# Patient Record
Sex: Female | Born: 2000 | Race: White | Hispanic: No | Marital: Single | Smoking: Never smoker
Health system: Southern US, Community
[De-identification: ages and names within clinical notes are randomized; demographics above are authoritative.]

## PROBLEM LIST (undated history)

## (undated) DIAGNOSIS — J45909 Unspecified asthma, uncomplicated: Secondary | ICD-10-CM

## (undated) HISTORY — PX: TONSILLECTOMY AND ADENOIDECTOMY: SUR1326

## (undated) HISTORY — PX: ADENOIDECTOMY: SUR15

---
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
## 2022-04-24 NOTE — L&D Delivery Note (Signed)
[facility] Delivery Note  Brooke Small is a 22 y.o. G1P0000 s/p SVD at [redacted]w[redacted]d. She was admitted for IOL for severe pre E.   ROM: 15h 36m with clear fluid GBS Status:  NEGATIVE/-- (10/04 1913) Maximum Maternal Temperature:  Temp (24hrs), Avg:98 F (36.7 C), Min:97.8 F (36.6 C), Max:98.2 F (36.8 C)    Labor Progress: Patient arrived at 0 cm dilation and was induced with foley, AROM, pitocin.   Delivery Date/Time: 01/28/2023 at 1504. Delivery: Called to room because of deceleration followed by difficulty tracing fetal heart rate.  On evaluation patient was complete and +3.  Asked patient to immediately start pushing.  Head delivered in direct OA position. No nuchal cord present. Shoulder and body delivered in usual fashion. Infant with spontaneous cry, placed on mother's abdomen, dried and stimulated. Cord clamped x 2 after 1-minute delay, and cut by provider. Cord blood drawn. Placenta delivered spontaneously with gentle cord traction. Fundus firm with massage and Pitocin. Labia, perineum, vagina, and cervix inspected with 1st degree perineal laceration .   Placenta:  spontaneous, intact, 3 vessel cord, cord pH collected and was 7.16 Complications: None Lacerations: First-degree perineal repaired with 3-0 Vicryl EBL: 41 mL Analgesia: Epidural in place  Infant: APGAR (1 MIN):   APGAR (5 MINS):   APGAR (10 MINS):    Weight: 1280 g  Derrel Nip, MD  OB Fellow  01/28/2023 3:44 PM

## 2022-04-26 DIAGNOSIS — J029 Acute pharyngitis, unspecified: Secondary | ICD-10-CM | POA: Diagnosis not present

## 2022-04-26 DIAGNOSIS — B9689 Other specified bacterial agents as the cause of diseases classified elsewhere: Secondary | ICD-10-CM | POA: Diagnosis not present

## 2022-04-26 DIAGNOSIS — Z03818 Encounter for observation for suspected exposure to other biological agents ruled out: Secondary | ICD-10-CM | POA: Diagnosis not present

## 2022-04-26 DIAGNOSIS — J209 Acute bronchitis, unspecified: Secondary | ICD-10-CM | POA: Diagnosis not present

## 2022-04-26 DIAGNOSIS — J019 Acute sinusitis, unspecified: Secondary | ICD-10-CM | POA: Diagnosis not present

## 2022-07-31 ENCOUNTER — Ambulatory Visit (LOCAL_COMMUNITY_HEALTH_CENTER): Payer: 59

## 2022-07-31 VITALS — BP 114/59 | Ht 65.0 in | Wt 271.5 lb

## 2022-07-31 DIAGNOSIS — Z3201 Encounter for pregnancy test, result positive: Secondary | ICD-10-CM

## 2022-07-31 LAB — PREGNANCY, URINE: Preg Test, Ur: POSITIVE — AB

## 2022-07-31 MED ORDER — PRENATAL 27-0.8 MG PO TABS: 1.0000 | ORAL_TABLET | Freq: Every day | ORAL | 0 refills | Status: AC

## 2022-07-31 NOTE — Progress Notes (Signed)
UPT Positive. Plans prenatal care at [facility] and has appt 08/15/2022. Positive preg packet given and reviewed.   The patient was dispensed prenatal vitamins #100 today per SO Dr Ralene Bathe. I provided counseling today regarding the medication. We discussed the medication, the side effects and when to call clinic. Patient given the opportunity to ask questions. Questions answered.    Sent to DSS for medicaid/preg women. Jerel Shepherd, RN

## 2022-08-15 ENCOUNTER — Encounter: Payer: Self-pay | Admitting: Licensed Practical Nurse

## 2022-08-17 ENCOUNTER — Ambulatory Visit (INDEPENDENT_AMBULATORY_CARE_PROVIDER_SITE_OTHER): Payer: 59

## 2022-08-17 ENCOUNTER — Telehealth: Payer: Self-pay | Admitting: Advanced Practice Midwife

## 2022-08-17 VITALS — Wt 268.0 lb

## 2022-08-17 DIAGNOSIS — Z369 Encounter for antenatal screening, unspecified: Secondary | ICD-10-CM

## 2022-08-17 DIAGNOSIS — Z3689 Encounter for other specified antenatal screening: Secondary | ICD-10-CM

## 2022-08-17 DIAGNOSIS — Z131 Encounter for screening for diabetes mellitus: Secondary | ICD-10-CM

## 2022-08-17 DIAGNOSIS — O0990 Supervision of high risk pregnancy, unspecified, unspecified trimester: Secondary | ICD-10-CM | POA: Insufficient documentation

## 2022-08-17 DIAGNOSIS — Z3480 Encounter for supervision of other normal pregnancy, unspecified trimester: Secondary | ICD-10-CM | POA: Insufficient documentation

## 2022-08-17 NOTE — Progress Notes (Signed)
New OB Intake  I connected with  Brooke Small on 08/17/22 at  1:15 PM EDT by telephone and verified that I am speaking with the correct person using two identifiers. Nurse is located at [facility] and pt is located at home.  I explained I am completing New OB Intake today. We discussed her EDD of 03/22/2023 that is based on LMP of 06/15/2022. Pt is G1/P0. I reviewed her allergies, medications, Medical/Surgical/OB history, and appropriate screenings. There are no cats in the home.  Based on history, this is a/an pregnancy uncomplicated .   Patient Active Problem List   Diagnosis Date Noted   Supervision of other normal pregnancy, antepartum 08/17/2022    Concerns addressed today None  Delivery Plans:  Plans to deliver at [facility].  Anatomy US Explained first scheduled [place] will be scheduled soon and an anatomy scan will be done at 20 weeks.  Labs Discussed genetic screening with patient. Patient desires genetic testing to be drawn with new OB labs. Discussed possible labs to be drawn at new OB appointment.  COVID Vaccine Patient has had COVID vaccine.   Social Determinants of Health Food Insecurity: denies food insecurity Transportation: Patient denies transportation needs.  First visit review I reviewed new OB appt with pt. I explained she will have ob bloodwork and pap smear/pelvic exam if indicated. Explained pt will be seen by Tresea Mall, CNM at first visit; encounter routed to appropriate provider.   Loran Senters, [place] 08/17/2022  2:00 PM

## 2022-08-17 NOTE — Telephone Encounter (Addendum)
Called patient left message for her to call back to schedule a dating scan.  She needs to schedule this scan on or before Aug 30, 2021(which is the day of her labs)

## 2022-08-22 NOTE — Telephone Encounter (Signed)
The patient is scheduled for 5/9 for dating and labs

## 2022-08-31 ENCOUNTER — Other Ambulatory Visit: Payer: 59

## 2022-08-31 ENCOUNTER — Ambulatory Visit (INDEPENDENT_AMBULATORY_CARE_PROVIDER_SITE_OTHER): Payer: 59

## 2022-08-31 ENCOUNTER — Other Ambulatory Visit: Payer: Self-pay | Admitting: Advanced Practice Midwife

## 2022-08-31 DIAGNOSIS — Z3687 Encounter for antenatal screening for uncertain dates: Secondary | ICD-10-CM | POA: Diagnosis not present

## 2022-08-31 DIAGNOSIS — Z131 Encounter for screening for diabetes mellitus: Secondary | ICD-10-CM

## 2022-08-31 DIAGNOSIS — Z3A10 10 weeks gestation of pregnancy: Secondary | ICD-10-CM

## 2022-08-31 DIAGNOSIS — Z3480 Encounter for supervision of other normal pregnancy, unspecified trimester: Secondary | ICD-10-CM

## 2022-08-31 DIAGNOSIS — Z369 Encounter for antenatal screening, unspecified: Secondary | ICD-10-CM

## 2022-09-01 ENCOUNTER — Other Ambulatory Visit (HOSPITAL_COMMUNITY)
Admission: RE | Admit: 2022-09-01 | Discharge: 2022-09-01 | Disposition: A | Discharge status: Home / Self Care | Payer: 59 | Source: Ambulatory Visit | Attending: Advanced Practice Midwife | Admitting: Advanced Practice Midwife

## 2022-09-01 DIAGNOSIS — Z3480 Encounter for supervision of other normal pregnancy, unspecified trimester: Secondary | ICD-10-CM | POA: Insufficient documentation

## 2022-09-01 DIAGNOSIS — Z369 Encounter for antenatal screening, unspecified: Secondary | ICD-10-CM | POA: Diagnosis not present

## 2022-09-02 LAB — URINALYSIS, ROUTINE W REFLEX MICROSCOPIC
Bilirubin, UA: NEGATIVE
Ketones, UA: NEGATIVE
Leukocytes,UA: NEGATIVE
Nitrite, UA: NEGATIVE
RBC, UA: NEGATIVE
Specific Gravity, UA: 1.028 (ref 1.005–1.030)
Urobilinogen, Ur: 1 mg/dL (ref 0.2–1.0)
pH, UA: >=9 — AB (ref 5.0–7.5)

## 2022-09-02 LAB — MICROSCOPIC EXAMINATION
Casts: NONE SEEN /lpf
Epithelial Cells (non renal): >10 /hpf — AB (ref 0–10)
WBC, UA: NONE SEEN /hpf (ref 0–5)

## 2022-09-03 LAB — CULTURE, OB URINE

## 2022-09-03 LAB — URINE CULTURE, OB REFLEX

## 2022-09-04 LAB — URINE CYTOLOGY ANCILLARY ONLY
Chlamydia: NEGATIVE
Comment: NEGATIVE
Comment: NORMAL
Neisseria Gonorrhea: NEGATIVE

## 2022-09-07 ENCOUNTER — Encounter: Payer: Self-pay | Admitting: Advanced Practice Midwife

## 2022-09-07 ENCOUNTER — Ambulatory Visit: Payer: 59 | Admitting: Advanced Practice Midwife

## 2022-09-07 VITALS — BP 126/76 | HR 98 | Wt 265.0 lb

## 2022-09-07 DIAGNOSIS — Z3A12 12 weeks gestation of pregnancy: Secondary | ICD-10-CM

## 2022-09-07 DIAGNOSIS — O99210 Obesity complicating pregnancy, unspecified trimester: Secondary | ICD-10-CM | POA: Insufficient documentation

## 2022-09-07 DIAGNOSIS — Z369 Encounter for antenatal screening, unspecified: Secondary | ICD-10-CM

## 2022-09-07 DIAGNOSIS — O0991 Supervision of high risk pregnancy, unspecified, first trimester: Secondary | ICD-10-CM

## 2022-09-07 DIAGNOSIS — Z3689 Encounter for other specified antenatal screening: Secondary | ICD-10-CM

## 2022-09-07 DIAGNOSIS — E669 Obesity, unspecified: Secondary | ICD-10-CM

## 2022-09-07 DIAGNOSIS — O99211 Obesity complicating pregnancy, first trimester: Secondary | ICD-10-CM | POA: Diagnosis not present

## 2022-09-07 DIAGNOSIS — O99212 Obesity complicating pregnancy, second trimester: Secondary | ICD-10-CM

## 2022-09-07 LAB — CBC/D/PLT+RPR+RH+ABO+RUBIGG...
Antibody Screen: NEGATIVE
Basophils Absolute: 0 10*3/uL (ref 0.0–0.2)
Basos: 0 %
EOS (ABSOLUTE): 0.1 10*3/uL (ref 0.0–0.4)
Eos: 1 %
HCV Ab: NONREACTIVE
HIV Screen 4th Generation wRfx: NONREACTIVE
Hematocrit: 37.4 % (ref 34.0–46.6)
Hemoglobin: 12.6 g/dL (ref 11.1–15.9)
Hepatitis B Surface Ag: NEGATIVE
Immature Grans (Abs): 0 10*3/uL (ref 0.0–0.1)
Immature Granulocytes: 0 %
Lymphocytes Absolute: 2.3 10*3/uL (ref 0.7–3.1)
Lymphs: 21 %
MCH: 26.7 pg (ref 26.6–33.0)
MCHC: 33.7 g/dL (ref 31.5–35.7)
MCV: 79 fL (ref 79–97)
Monocytes Absolute: 0.5 10*3/uL (ref 0.1–0.9)
Monocytes: 5 %
Neutrophils Absolute: 8.1 10*3/uL — ABNORMAL HIGH (ref 1.4–7.0)
Neutrophils: 73 %
Platelets: 297 10*3/uL (ref 150–450)
RBC: 4.72 x10E6/uL (ref 3.77–5.28)
RDW: 13.4 % (ref 11.7–15.4)
RPR Ser Ql: NONREACTIVE
Rh Factor: POSITIVE
Rubella Antibodies, IGG: <0.9 index — ABNORMAL LOW (ref >0.99)
Varicella zoster IgG: <135 index — ABNORMAL LOW (ref >165)
WBC: 11.1 10*3/uL — ABNORMAL HIGH (ref 3.4–10.8)

## 2022-09-07 LAB — MATERNIT 21 PLUS CORE, BLOOD: Fetal Fraction: <3

## 2022-09-07 LAB — MONITOR DRUG PROFILE 14(MW)

## 2022-09-07 LAB — NICOTINE SCREEN, URINE

## 2022-09-07 LAB — HEMOGLOBIN A1C
Est. average glucose Bld gHb Est-mCnc: 117 mg/dL
Hgb A1c MFr Bld: 5.7 % — ABNORMAL HIGH (ref 4.8–5.6)

## 2022-09-07 LAB — HCV INTERPRETATION

## 2022-09-07 MED ORDER — ASPIRIN 81 MG PO TBEC
81.0000 mg | DELAYED_RELEASE_TABLET | Freq: Every day | ORAL | 2 refills | Status: DC
Start: 2022-09-07 — End: 2022-10-04

## 2022-09-07 NOTE — Progress Notes (Signed)
U/S Order updated to reflect 18-20 wk MFM Anatomy scan. Previous requested date was incorrect.

## 2022-09-07 NOTE — Progress Notes (Signed)
[facility]   New Obstetric Patient H&P    Chief Complaint: "Desires prenatal care"   History of Present Illness: Patient is a 22 y.o. G1P0000 Not Hispanic or Latino female, presents with amenorrhea and positive home pregnancy test. Patient's last menstrual period was 06/15/2022 (exact date). and based on her  LMP, her EDD is Estimated Date of Delivery: 03/22/23 and her EGA is [redacted]w[redacted]d. Cycles are 3-4 days, regular, and occur approximately every : 28 days.    She had a urine pregnancy test which was positive 6 week(s)  ago. Her last menstrual period was normal and lasted for  3 or 4 day(s). Since her LMP she claims she has experienced breast tenderness, fatigue, nausea, vomiting. She denies vaginal bleeding. Her past medical history is noncontributory. Her prior pregnancies are notable for none  Since her LMP, she admits to the use of tobacco products  no She claims she has lost  7  pounds since the start of her pregnancy.  There are cats in the home in the home  no  She admits close contact with children on a regular basis  no  She has had chicken pox in the past yes She has had Tuberculosis exposures, symptoms, or previously tested positive for TB   no Current or past history of domestic violence. no  Genetic Screening/Teratology Counseling: (Includes patient, baby's father, or anyone in either family with:)   1. Patient's age >/= 70 at [facility]  no 2. Thalassemia ([place], [place], Mediterranean, or Asian background): MCV<80  no 3. Neural tube defect (meningomyelocele, spina bifida, anencephaly)  no 4. Congenital heart defect  no  5. Down syndrome  no 6. Tay-Sachs (Jewish, [place]  no 7. Canavan's Disease  no 8. Sickle cell disease or trait (African)  no  9. Hemophilia or other blood disorders  no  10. Muscular dystrophy  no  11. Cystic fibrosis  no  12. Huntington's Chorea  no  13. Mental retardation/autism  no 14. Other inherited genetic or chromosomal disorder  no 15.  Maternal metabolic disorder (DM, PKU, etc)  no 16. Patient or FOB with a child with a birth defect not listed above no  16a. Patient or FOB with a birth defect themselves no 17. Recurrent pregnancy loss, or stillbirth  no  18. Any medications since LMP other than prenatal vitamins (include vitamins, supplements, OTC meds, drugs, alcohol)  no 19. Any other genetic/environmental exposure to discuss  no  Infection History:   1. Lives with someone with TB or TB exposed  no  2. Patient or partner has history of genital herpes  no 3. Rash or viral illness since LMP  no 4. History of STI (GC, CT, HPV, syphilis, HIV)  no 5. History of recent travel :  no  Other pertinent information:  She is a Lawyer at [employer]. FOB is not involved in the pregnancy. She lives with her dad. Her mom accompanied her to the visit today. She admits good support system.   Review of Systems:10 point review of systems negative unless otherwise noted in HPI  Past Medical History:  Patient Active Problem List   Diagnosis Date Noted   Obesity affecting pregnancy 09/07/2022   Supervision of high-risk pregnancy 08/17/2022     Clinical Staff Provider  Office Location  [facility] Dating  By LMP c/w 10w u/s  Language  English Anatomy US    Flu Vaccine  UTD Genetic Screen  NIPS:   TDaP vaccine   ACHD Hgb  A1C or  GTT Early : Hgb A1C 5.7 Third trimester :   Covid One dose   LAB RESULTS   Rhogam  A/Positive/-- (05/09 1630)  Blood Type A/Positive/-- (05/09 1630)   Feeding Plan breast Antibody Negative (05/09 1630)  Contraception undecided Rubella <0.90 (05/09 1630)  Circumcision yes RPR Non Reactive (05/09 1630)   Pediatrician  undecided HBsAg Negative (05/09 1630)   Support Person Raiden HIV Non Reactive (05/09 1630)  Prenatal Classes  Varicella     GBS  (For PCN allergy, check sensitivities)   BTL Consent  Hep C Non Reactive (05/09 1630)   VBAC Consent NA Pap No results found for: "DIAGPAP"    Hgb Electro      CF       SMA        Obesity BMI 45 at NOB Early Hgb A1C 5.7 prediabetes- referral to Lifestyles sent Low dose ASA start at 12 weeks Anesthesia consult Growth scans NSTs    Anxiety 09/06/2015    Past Surgical History:  Past Surgical History:  Procedure Laterality Date   TONSILLECTOMY AND ADENOIDECTOMY      Gynecologic History: Patient's last menstrual period was 06/15/2022 (exact date).  Obstetric History: G1P0000  Family History:  Family History  Problem Relation Age of Onset   Healthy Father    Healthy Sister    Healthy Brother    Heart attack Maternal Grandmother    Hypertension Maternal Grandmother    Diabetes Maternal Grandmother    Healthy Maternal Grandfather    Diabetes Paternal Grandfather    Liver disease Paternal Grandfather        failure   Heart attack Paternal Grandfather     Social History:  Social History   Socioeconomic History   Marital status: Single    Spouse name: Not on file   Number of children: 0   Years of education: 14   Highest education level: Not on file  Occupational History   Occupation: Psychologist, sport and exercise at [employer]  Tobacco Use   Smoking status: Never   Smokeless tobacco: Never  Vaping Use   Vaping Use: Never used  Substance and Sexual Activity   Alcohol use: Never   Drug use: Never   Sexual activity: Yes    Partners: Male    Birth control/protection: None  Other Topics Concern   Not on file  Social History Narrative   Not on file   Social Determinants of Health   Financial Resource Strain: Low Risk  (08/17/2022)   Overall Financial Resource Strain (CARDIA)    Difficulty of Paying Living Expenses: Not very hard  Food Insecurity: No Food Insecurity (08/17/2022)   Hunger Vital Sign    Worried About Running Out of Food in the Last Year: Never true    Ran Out of Food in the Last Year: Never true  Transportation Needs: No Transportation Needs (08/17/2022)   PRAPARE - Administrator, Civil Service (Medical): No    Lack of  Transportation (Non-Medical): No  Physical Activity: Inactive (08/17/2022)   Exercise Vital Sign    Days of Exercise per Week: 0 days    Minutes of Exercise per Session: 0 min  Stress: No Stress Concern Present (08/17/2022)   [employer] of Occupational Health - Occupational Stress Questionnaire    Feeling of Stress : Not at all  Social Connections: Unknown (08/17/2022)   Social Connection and Isolation Panel [NHANES]    Frequency of Communication with Friends and Family: Three times a  week    Frequency of Social Gatherings with Friends and Family: Twice a week    Attends Religious Services: More than 4 times per year    Active Member of [employer] or Organizations: No    Attends Banker Meetings: Never    Marital Status: Not on file  Intimate Partner Violence: Not At Risk (08/17/2022)   Humiliation, Afraid, Rape, and Kick questionnaire    Fear of Current or Ex-Partner: No    Emotionally Abused: No    Physically Abused: No    Sexually Abused: No    Allergies:  No Known Allergies  Medications: Prior to Admission medications   Medication Sig Start Date End Date Taking? Authorizing Provider  aspirin EC 81 MG tablet Take 1 tablet (81 mg total) by mouth daily. Take after 12 weeks for prevention of preeclampssia later in pregnancy 09/07/22  Yes Tresea Mall, CNM  Prenatal Vit-Fe Fumarate-FA (MULTIVITAMIN-PRENATAL) 27-0.8 MG TABS tablet Take 1 tablet by mouth daily at 12 noon. 07/31/22 11/08/22 Yes Federico Flake, MD    Physical Exam Vitals: Blood pressure 126/76, pulse 98, weight 265 lb (120.2 kg), last menstrual period 06/15/2022.  General: NAD HEENT: normocephalic, anicteric Thyroid: no enlargement, no palpable nodules Pulmonary: No increased work of breathing, CTAB Cardiovascular: RRR, distal pulses 2+ Abdomen: NABS, soft, non-tender, non-distended.  Umbilicus without lesions.  No hepatomegaly, splenomegaly or masses palpable. No evidence of hernia   Genitourinary: deferred for no concerns/labs previously done Extremities: no edema, erythema, or tenderness Neurologic: Grossly intact Psychiatric: mood appropriate, affect full   The following were addressed during this visit:  Breastfeeding Education - Early initiation of breastfeeding    Comments: Keeps milk supply adequate, helps contract uterus and slow bleeding, and early milk is the perfect first food and is easy to digest.   - The importance of exclusive breastfeeding    Comments: Provides antibodies, Lower risk of breast and ovarian cancers, and type-2 diabetes,Helps your body recover, Reduced chance of SIDS.   - Risks of giving your baby anything other than breast milk if you are breastfeeding    Comments: Make the baby less content with breastfeeds, may make my baby more susceptible to illness, and may reduce my milk supply.   - The importance of early skin-to-skin contact    Comments:  Keeps baby warm and secure, helps keep baby's blood sugar up and breathing steady, easier to bond and breastfeed, and helps calm baby.  - Rooming-in on a 24-hour basis    Comments: Easier to learn baby's feeding cues, easier to bond and get to know each other, and encourages milk production.   - Feeding on demand or baby-led feeding    Comments: Helps prevent breastfeeding complications, helps bring in good milk supply, prevents under or overfeeding, and helps baby feel content and satisfied   - Frequent feeding to help assure optimal milk production    Comments: Making a full supply of milk requires frequent removal of milk from breasts, infant will eat 8-12 times in 24 hours, if separated from infant use breast massage, hand expression and/ or pumping to remove milk from breasts.   - Effective positioning and attachment    Comments: Helps my baby to get enough breast milk, helps to produce an adequate milk supply, and helps prevent nipple pain and damage   - Exclusive  breastfeeding for the first 6 months    Comments: Builds a healthy milk supply and keeps it up, protects baby from sickness and disease,  and breastmilk has everything your baby needs for the first 6 months.    Assessment: 22 y.o. G1P0000 at [redacted]w[redacted]d presenting to initiate prenatal care  Plan: 1) Avoid alcoholic beverages. 2) Patient encouraged not to smoke.  3) Discontinue the use of all non-medicinal drugs and chemicals.  4) Take prenatal vitamins daily.  5) Nutrition, food safety (fish, cheese advisories, and high nitrite foods) and exercise discussed. 6) Hospital and practice style discussed with cross coverage system.  7) Genetic Screening, such as with 1st Trimester Screening, cell free fetal DNA, AFP testing, and Ultrasound, as well as with amniocentesis and CVS as appropriate, is discussed with patient. At the conclusion of today's visit patient requested genetic testing 8) Patient is asked about travel to areas at risk for the Zika virus, and counseled to avoid travel and exposure to mosquitoes or sexual partners who may have themselves been exposed to the virus. Testing is discussed, and will be ordered as appropriate.  9) Return in 1 week for repeat lab draw MaterniT 21. Previous order canceled due to insufficient fetal DNA 10) Return to clinic in 4 weeks for ROB   Tresea Mall, CNM  Ob/Gyn [facility] 09/07/2022 5:16 PM

## 2022-09-07 NOTE — Addendum Note (Signed)
Addended by: Tresea Mall on: 09/07/2022 05:47 PM   Modules accepted: Orders

## 2022-09-07 NOTE — Patient Instructions (Signed)
Prenatal Care Prenatal care is health care during pregnancy. It helps you and your unborn baby (fetus) stay as healthy as possible. Prenatal care may be provided by a midwife, a family practice doctor, a mid-level practitioner (nurse practitioner or physician assistant), or a childbirth and pregnancy doctor (obstetrician). How does this affect me? During pregnancy, you will be closely monitored for any new conditions that might develop. To lower your risk of pregnancy complications, you and your health care provider will talk about any underlying conditions you have. How does this affect my baby? Early and consistent prenatal care increases the chance that your baby will be healthy during pregnancy. Prenatal care lowers the risk that your baby will be: Born early (prematurely). Smaller than expected at birth (small for gestational age). What can I expect at the first prenatal care visit? Your first prenatal care visit will likely be the longest. You should schedule your first prenatal care visit as soon as you know that you are pregnant. Your first visit is a good time to talk about any questions or concerns you have about pregnancy. Medical history At your visit, you and your health care provider will talk about your medical history, including: Any past pregnancies. Your family's medical history. Medical history of the baby's father. Any long-term (chronic) health conditions you have and how you manage them. Any surgeries or procedures you have had. Any current over-the-counter or prescription medicines, herbs, or supplements that you are taking. Other factors that could pose a risk to your baby, including: Exposure to harmful chemicals or radiation at work or at home. Any substance use, including tobacco, alcohol, and drug use. Your home setting and your stress levels, including: Exposure to abuse or violence. Household financial strain. Your daily health habits, including diet and  exercise. Tests and screenings Your health care provider will: Measure your weight, height, and blood pressure. Do a physical exam, including a pelvic and breast exam. Perform blood tests and urine tests to check for: Urinary tract infection. Sexually transmitted infections (STIs). Low iron levels in your blood (anemia). Blood type and certain proteins on red blood cells (Rh antibodies). Infections and immunity to viruses, such as hepatitis B and rubella. HIV (human immunodeficiency virus). Discuss your options for genetic screening. Tips about staying healthy Your health care provider will also give you information about how to keep yourself and your baby healthy, including: Nutrition and taking vitamins. Physical activity. How to manage pregnancy symptoms such as nausea and vomiting (morning sickness). Infections and substances that may be harmful to your baby and how to avoid them. Food safety. Dental care. Working. Travel. Warning signs to watch for and when to call your health care provider. How often will I have prenatal care visits? After your first prenatal care visit, you will have regular visits throughout your pregnancy. The visit schedule is often as follows: Up to week 28 of pregnancy: once every 4 weeks. 28-36 weeks: once every 2 weeks. After 36 weeks: every week until delivery. Some women may have visits more or less often depending on any underlying health conditions and the health of the baby. Keep all follow-up and prenatal care visits. This is important. What happens during routine prenatal care visits? Your health care provider will: Measure your weight and blood pressure. Check for fetal heart sounds. Measure the height of your uterus in your abdomen (fundal height). This may be measured starting around week 20 of pregnancy. Check the position of your baby inside your uterus. Ask questions   about your diet, sleeping patterns, and whether you can feel the baby  move. Review warning signs to watch for and signs of labor. Ask about any pregnancy symptoms you are having and how you are dealing with them. Symptoms may include: Headaches. Nausea and vomiting. Vaginal discharge. Swelling. Fatigue. Constipation. Changes in your vision. Feeling persistently sad or anxious. Any discomfort, including back or pelvic pain. Bleeding or spotting. Make a list of questions to ask your health care provider at your routine visits. What tests might I have during prenatal care visits? You may have blood, urine, and imaging tests throughout your pregnancy, such as: Urine tests to check for glucose, protein, or signs of infection. Glucose tests to check for a form of diabetes that can develop during pregnancy (gestational diabetes mellitus). This is usually done around week 24 of pregnancy. Ultrasounds to check your baby's growth and development, to check for birth defects, and to check your baby's well-being. These can also help to decide when you should deliver your baby. A test to check for group B strep (GBS) infection. This is usually done around week 36 of pregnancy. Genetic testing. This may include blood, fluid, or tissue sampling, or imaging tests, such as an ultrasound. Some genetic tests are done during the first trimester and some are done during the second trimester. What else can I expect during prenatal care visits? Your health care provider may recommend getting certain vaccines during pregnancy. These may include: A yearly flu shot (annual influenza vaccine). This is especially important if you will be pregnant during flu season. Tdap (tetanus, diphtheria, pertussis) vaccine. Getting this vaccine during pregnancy can protect your baby from whooping cough (pertussis) after birth. This vaccine may be recommended between weeks 27 and 36 of pregnancy. A COVID-19 vaccine. Later in your pregnancy, your health care provider may give you information  about: Childbirth and breastfeeding classes. Choosing a health care provider for your baby. Umbilical cord banking. Breastfeeding. Birth control after your baby is born. The hospital labor and delivery unit and how to set up a tour. Registering at the hospital before you go into labor. Where to find more information Office on Women's Health: womenshealth.gov American Pregnancy Association: americanpregnancy.org March of Dimes: marchofdimes.org Summary Prenatal care helps you and your baby stay as healthy as possible during pregnancy. Your first prenatal care visit will most likely be the longest. You will have visits and tests throughout your pregnancy to monitor your health and your baby's health. Bring a list of questions to your visits to ask your health care provider. Make sure to keep all follow-up and prenatal care visits. This information is not intended to replace advice given to you by your health care provider. Make sure you discuss any questions you have with your health care provider. Document Revised: 01/22/2020 Document Reviewed: 01/22/2020 Elsevier Patient Education  2023 [employer] Exercise During Pregnancy Exercise is an important part of being healthy for people of all ages. Exercise improves the function of your heart and lungs and helps you maintain strength, flexibility, and a healthy body weight. Exercise also boosts energy levels and elevates mood. Most women should exercise regularly during pregnancy. Exercise routines may need to change as your pregnancy progresses. In rare cases, women with certain medical conditions or complications may be asked to limit or avoid exercise during pregnancy. Your health care provider will give you information on what will work for you. How does this affect me? Along with maintaining general strength and flexibility, exercising during   pregnancy can help: Keep strength in muscles that are used during labor and  childbirth. Decrease low back pain or symptoms of depression. Control weight gain during pregnancy. Reduce the risk of needing insulin if you develop diabetes during pregnancy. Decrease the risk of cesarean delivery. Speed up your recovery after giving birth. Relieve constipation. How does this affect my baby? Exercise can help you have a healthy pregnancy. Exercise does not cause early (premature) birth. It will not cause your baby to weigh less at birth. What exercises can I do? Many exercises are safe for you to do during pregnancy. Do a variety of exercises that safely increase your heart and breathing rates and help you build and maintain muscle strength. Do exercises exactly as told by your health care provider. You may do these exercises: Walking. Swimming. Water aerobics. Riding a stationary bike. Modified yoga or Pilates. Tell your instructor that you are pregnant. Avoid overstretching, and avoid lying on your back for long periods of time. Running or jogging. Choose this type of exercise only if: You ran or jogged regularly before your pregnancy. You can run or jog and still talk in complete sentences. What exercises should I avoid? You may be told to limit high-intensity exercise depending on your level of fitness and whether you exercised regularly before you were pregnant. You can tell that you are exercising at a high intensity if you are breathing much harder and faster and cannot hold a conversation while exercising. You must avoid: Contact sports. Activities that put you at risk for falling on or being hit in the belly, such as downhill skiing, waterskiing, surfing, rock climbing, cycling, gymnastics, and horseback riding. Scuba diving. Skydiving. Hot yoga or hot Pilates. These activities take place in a room that is heated to high temperatures. Jogging or running, unless you jogged or ran regularly before you were pregnant. While jogging or running, you should always be  able to talk in full sentences. Do not run or jog so fast that you are unable to have a conversation. Do not exercise at more than 6,000 feet above sea level (high elevation) if you are not used to exercising at high elevation. How do I exercise in a safe way?  Avoid overheating. Do not exercise in very high temperatures. Wear loose-fitting, breathable clothes. Avoid dehydration. Drink enough fluid before, during, and after exercise to keep your urine pale yellow. Avoid overstretching. Because of hormone changes during pregnancy, it is easy to overstretch muscles, tendons, and ligaments. Start slowly and ask your health care provider to recommend the types of exercise that are safe for you. Do not exercise to lose weight. Wear a sports bra to support your breasts. Avoid standing still or lying flat on your back as much as you can. Follow these instructions at home: Exercise on most days or all days of the week. Try to exercise for 30 minutes a day, 5 days a week, unless your health care provider tells you not to. If you actively exercised before your pregnancy and you are healthy, your health care provider may tell you to continue to do moderate-intensity to high-intensity exercise. If you are just starting to exercise or did not exercise much before your pregnancy, your health care provider may tell you to do low-intensity to moderate-intensity exercise. Questions to ask your health care provider Is exercise safe for me? What are signs that I should stop exercising? Does my health condition mean that I should not exercise during pregnancy? When should I   avoid exercising during pregnancy? Stop exercising and contact a health care provider if: You have any unusual symptoms such as: Mild contractions of the uterus or cramps in the abdomen. A dizzy feeling that does not go away when you rest. Stop exercising and get help right away if: You have any unusual symptoms such as: Sudden, severe  pain in your low back or your belly. Regular, painful contractions of your uterus. Chest pain. Bleeding or fluid leaking from your vagina. Shortness of breath. Headache. Pain and swelling of your calves. Summary Most women should exercise regularly throughout pregnancy. In rare cases, women with certain medical conditions or complications may be asked to limit or avoid exercise during pregnancy. Do not exercise to lose weight during pregnancy. Your health care provider will tell you what level of physical activity is right for you. Stop exercising and contact a health care provider if you have unusual symptoms, such as mild contractions or dizziness. This information is not intended to replace advice given to you by your health care provider. Make sure you discuss any questions you have with your health care provider. Document Revised: 11/26/2019 Document Reviewed: 11/26/2019 Elsevier Patient Education  2023 [employer] Eating Plan for Pregnant Women While you are pregnant, your body requires additional nutrition to help support your growing baby. You also have a higher need for some vitamins and minerals, such as folic acid, calcium, iron, and vitamin D. Eating a healthy, well-balanced diet is very important for your health and your baby's health. Your need for extra calories varies over the course of your pregnancy. Pregnancy is divided into three trimesters, with each trimester lasting 3 months. For most women, it is recommended to consume: 150 extra calories a day during the first trimester. 300 extra calories a day during the second trimester. 300 extra calories a day during the third trimester. What are tips for following this plan? Cooking Practice good food safety and cleanliness. Wash your hands before you eat and after you prepare raw meat. Wash all fruits and vegetables well before peeling or eating. Taking these actions can help to prevent foodborne illnesses that can be very  dangerous to your baby, such as listeriosis. Ask your health care provider for more information about listeriosis. Make sure that all meats, poultry, and eggs are cooked to food-safe temperatures or "well-done." Meal planning  Eat a variety of foods (especially fruits and vegetables) to get a full range of vitamins and minerals. Two or more servings of fish are recommended each week in order to get the most benefits from omega-3 fatty acids that are found in seafood. Choose fish that are lower in mercury, such as salmon and pollock. Limit your overall intake of foods that have "empty calories." These are foods that have little nutritional value, such as sweets, desserts, candies, and sugar-sweetened beverages. Drinks that contain caffeine are okay to drink, but it is better to avoid caffeine. Keep your total caffeine intake to less than 200 mg each day (which is 12 oz or 355 mL of coffee, tea, or soda) or the limit as told by your health care provider. General information Do not try to lose weight or go on a diet during pregnancy. Take a prenatal vitamin to help meet your additional vitamin and mineral needs during pregnancy, specifically for folic acid, iron, calcium, and vitamin D. Remember to stay active. Ask your health care provider what types of exercise and activities are safe for you. What does 150 extra calories look like?   Healthy options that provide 150 extra calories each day could be any of the following: 6-8 oz (170-227 g) plain low-fat yogurt with  cup (70 g) berries. 1 apple with 2 tsp (11 g) peanut butter. Cut-up vegetables with  cup (60 g) hummus. 8 fl oz (237 mL) low-fat chocolate milk. 1 stick of string cheese with 1 medium orange. 1 peanut butter and jelly sandwich that is made with one slice of whole-wheat bread and 1 tsp (5 g) of peanut butter. For 300 extra calories, you could eat two of these healthy options each day. What is a healthy amount of weight to gain? The  right amount of weight gain for you is based on your BMI (body mass index) before you became pregnant. If your BMI was less than 18 (underweight), you should gain 28-40 lb (13-18 kg). If your BMI was 18-24.9 (normal), you should gain 25-35 lb (11-16 kg). If your BMI was 25-29.9 (overweight), you should gain 15-25 lb (7-11 kg). If your BMI was 30 or greater (obese), you should gain 11-20 lb (5-9 kg). What if I am having twins or multiples? Generally, if you are carrying twins or multiples: You may need to eat 300-600 extra calories a day. The recommended range for total weight gain is 25-54 lb (11-25 kg), depending on your BMI before pregnancy. Talk with your health care provider to find out about nutritional needs, weight gain, and exercise that is right for you. What foods should I eat?  Fruits All fruits. Eat a variety of colors and types of fruit. Remember to wash your fruits well before peeling or eating. Vegetables All vegetables. Eat a variety of colors and types of vegetables. Remember to wash your vegetables well before peeling or eating. Grains All grains. Choose whole grains, such as whole-wheat bread, oatmeal, or brown rice. Meats and other protein foods Lean meats, including chicken, turkey, and lean cuts of beef, veal, or pork. Fish that is higher in omega-3 fatty acids and lower in mercury, such as salmon, herring, mussels, trout, sardines, pollock, shrimp, crab, and lobster. Tofu. Tempeh. Beans. Eggs. Peanut butter and other nut butters. Dairy Pasteurized milk and milk alternatives, such as almond milk. Pasteurized yogurt and pasteurized cheese. Cottage cheese. Sour cream. Beverages Water. Juices that contain 100% fruit juice or vegetable juice. Caffeine-free teas and decaffeinated coffee. Fats and oils Fats and oils are okay to include in moderation. Sweets and desserts Sweets and desserts are okay to include in moderation. Seasoning and other foods All pasteurized  condiments. The items listed above may not be a complete list of foods and beverages you can eat. Contact a dietitian for more information. What foods should I avoid? Fruits Raw (unpasteurized) fruit juices. Vegetables Unpasteurized vegetable juices. Meats and other protein foods Precooked or cured meat, such as bologna, hot dogs, sausages, or meat loaves. (If you must eat those meats, reheat them until they are steaming hot.) Refrigerated pate, meat spreads from a meat counter, or smoked seafood that is found in the refrigerated section of a store. Raw or undercooked meats, poultry, and eggs. Raw fish, such as sushi or sashimi. Fish that have high mercury content, such as tilefish, shark, swordfish, and king mackerel. Dairy Unpasteurized milk and any foods that have unpasteurized milk in them. Soft cheeses, such as feta, queso blanco, queso fresco, Brie, Camembert, panela, and blue-veined cheeses (unless they are made with pasteurized milk, which must be stated on the label). Beverages Alcohol. Sugar-sweetened beverages, such as sodas, teas, or   energy drinks. Seasoning and other foods Homemade fermented foods and drinks, such as pickles, sauerkraut, or kombucha drinks. (Store-bought pasteurized versions of these are okay.) Salads that are made in a store or deli, such as ham salad, chicken salad, egg salad, tuna salad, and seafood salad. The items listed above may not be a complete list of foods and beverages you should avoid. Contact a dietitian for more information. Where to find more information To calculate the number of calories you need based on your height, weight, and activity level, you can use an online calculator such as: www.myplate.gov/myplate-plan To calculate how much weight you should gain during pregnancy, you can use an online pregnancy weight gain calculator such as: www.myplate.gov To learn more about eating fish during pregnancy, talk with your health care provider or  visit: www.fda.gov Summary While you are pregnant, your body requires additional nutrition to help support your growing baby. Eat a variety of foods, especially fruits and vegetables, to get a full range of vitamins and minerals. Practice good food safety and cleanliness. Wash your hands before you eat and after you prepare raw meat. Wash all fruits and vegetables well before peeling or eating. Taking these actions can help to prevent foodborne illnesses, such as listeriosis, that can be very dangerous to your baby. Do not eat raw meat or fish. Do not eat fish that have high mercury content, such as tilefish, shark, swordfish, and king mackerel. Do not eat raw (unpasteurized) dairy. Take a prenatal vitamin to help meet your additional vitamin and mineral needs during pregnancy, specifically for folic acid, iron, calcium, and vitamin D. This information is not intended to replace advice given to you by your health care provider. Make sure you discuss any questions you have with your health care provider. Document Revised: 11/06/2019 Document Reviewed: 11/06/2019 Elsevier Patient Education  2023 [employer] Inc.  

## 2022-09-13 ENCOUNTER — Other Ambulatory Visit: Payer: 59

## 2022-09-13 DIAGNOSIS — O0991 Supervision of high risk pregnancy, unspecified, first trimester: Secondary | ICD-10-CM

## 2022-09-13 DIAGNOSIS — Z369 Encounter for antenatal screening, unspecified: Secondary | ICD-10-CM

## 2022-09-14 ENCOUNTER — Telehealth: Payer: Self-pay

## 2022-09-14 NOTE — Telephone Encounter (Signed)
Pt calling; woke up this morning with a brown rash on her arms; just spots; not raised; what to do?  [phone] Pt states it didn't itch or irritate or burn; skin was slightly tender where the rash was; pt took a shower and it's mostly gone now.  Adv if returns and is itchy, irritating may use benadryl cream during the day and benadryl pill at HS to help her sleep but to use sparingly or 1% hydrocortisone cream.  Pt appreciative.

## 2022-09-18 LAB — MATERNIT21 PLUS CORE+SCA
Fetal Fraction: 4
Monosomy X (Turner Syndrome): NOT DETECTED
Result (T21): NEGATIVE
Trisomy 13 (Patau syndrome): NEGATIVE
Trisomy 18 (Edwards syndrome): NEGATIVE
Trisomy 21 (Down syndrome): NEGATIVE
XXX (Triple X Syndrome): NOT DETECTED
XXY (Klinefelter Syndrome): NOT DETECTED
XYY (Jacobs Syndrome): NOT DETECTED

## 2022-10-04 ENCOUNTER — Ambulatory Visit (INDEPENDENT_AMBULATORY_CARE_PROVIDER_SITE_OTHER): Payer: 59 | Admitting: Certified Nurse Midwife

## 2022-10-04 VITALS — BP 113/66 | HR 88 | Wt 262.0 lb

## 2022-10-04 DIAGNOSIS — Z3A15 15 weeks gestation of pregnancy: Secondary | ICD-10-CM

## 2022-10-04 DIAGNOSIS — O99212 Obesity complicating pregnancy, second trimester: Secondary | ICD-10-CM | POA: Diagnosis not present

## 2022-10-04 DIAGNOSIS — O0992 Supervision of high risk pregnancy, unspecified, second trimester: Secondary | ICD-10-CM

## 2022-10-04 DIAGNOSIS — E669 Obesity, unspecified: Secondary | ICD-10-CM

## 2022-10-04 LAB — POCT URINALYSIS DIPSTICK OB
Bilirubin, UA: NEGATIVE
Blood, UA: NEGATIVE
Glucose, UA: NEGATIVE
Ketones, UA: NEGATIVE
Leukocytes, UA: NEGATIVE
Nitrite, UA: NEGATIVE
POC,PROTEIN,UA: NEGATIVE
Spec Grav, UA: 1.02
Urobilinogen, UA: 1 U/dL
pH, UA: 6

## 2022-10-04 MED ORDER — ASPIRIN 81 MG PO TBEC: 81.0000 mg | DELAYED_RELEASE_TABLET | Freq: Every day | ORAL | 2 refills | Status: DC

## 2022-10-04 NOTE — Patient Instructions (Signed)
Gestational Diabetes Mellitus, Diagnosis  Gestational diabetes mellitus, or gestational diabetes, is a form of diabetes that some women get during pregnancy. To control blood sugar (glucose) in the body, the pancreas makes a hormone called insulin. This hormone allows glucose to enter the cells in the body. The cells use glucose for energy. However, for some pregnant women, the pancreas may not make enough insulin or the body may not use available insulin properly. This leads to gestational diabetes. Gestational diabetes lasts only a short time. It usually happens at weeks 24-28 of pregnancy and goes away after delivery. However, women who get gestational diabetes are more likely to: Get it again if they become pregnant. Develop type 2 diabetes in the future. Gestational diabetes is not likely to cause problems if it is treated. If not controlled with treatment, it may cause problems during labor and delivery. Some problems can harm the baby and the mother. What are the causes? This condition occurs during pregnancy when the woman's body makes growth hormones that help the baby grow. Sometimes, these hormones: Cause the pancreas to work harder than normal to make more insulin. Sometimes, the pancreas still cannot make enough insulin. Cause insulin resistance. This makes it hard for the cells to use insulin properly. Insulin resistance or lack of insulin causes extra glucose to build up in the blood instead of going into cells. This leads to high blood glucose (hyperglycemia). What increases the risk? This condition is more likely to develop in pregnant women who: Are older than age 25 during pregnancy. Have a family history of diabetes. Are overweight. Have had gestational diabetes in the past. Have polycystic ovary syndrome (PCOS). Are pregnant with twins or other multiples. What are the signs or symptoms? Common symptoms of this condition include: Increased thirst. Increased hunger. Needing  to urinate more often. Most women do not notice these symptoms because the symptoms are similar to other symptoms of pregnancy. How is this diagnosed? This condition may be diagnosed based on your blood glucose level. This may be checked: After you fast for 8 hours or longer (fasting blood glucose test, or FBG). Any time of day, no matter when you eat (random glucose test). At weeks 24-28 of pregnancy, to check how your body responds to glucose (oral glucose tolerance test, or OGTT). If you have risk factors, you may be screened for type 2 diabetes at your first health care visit during your pregnancy. How is this treated? Treatment for this condition depends on the stage of your pregnancy and any other medical conditions you may have. Treatment may include: Eating a healthy diet and getting more physical activity. These are the most important ways to manage gestational diabetes. Checking your blood glucose as often as told. Taking insulin or other diabetes medicines every day. These medicines will be prescribed only if needed. You may need to work with a diabetes specialist, or endocrinologist, on a treatment plan. The goal of treatment is to have the right blood glucose levels during your pregnancy. The levels are checked while you are fasting and after you eat. Your health care provider will tell you what those levels are. Follow these instructions at home: Learn about your condition Learn as much as you can about your condition. Ask your health care provider: How often should I check my blood glucose, and where do I get the equipment? What diabetes medicines do I need, and when should I take them? Do I need to meet with a certified diabetes care and   education specialist? What number can I call if I have questions? Where can I find a support group for people with gestational diabetes? General instructions Take over-the-counter and prescription medicines only as told by your health care  provider. Manage your weight gain during pregnancy. Your expected weight gain depends on your BMI (body mass index) before pregnancy. Drink enough fluid to keep your urine pale yellow. Carry a medical alert card or wear medical alert jewelry that says you have gestational diabetes. Keep all follow-up visits. This is important. Where to find more information American Diabetes Association (ADA): diabetes.org Association of Diabetes Care & Education Specialists (ADCES): diabeteseducator.org Centers for Disease Control and Prevention (CDC): cdc.gov American Pregnancy Association: americanpregnancy.org U.[place]. Department of Agriculture MyPlate: myplate.gov Contact a health care provider if you: Have a blood glucose level at or above: 240 mg/dL (13.3 mmol/L). 200 mg/dL (11.1 mmol/L), and you have ketones in your urine. This is a sign that your body is using fat for energy because it is not making enough insulin. Have a fever. Have been sick for 2 days or more and are not getting better. Have either of these problems for more than 6 hours: Vomiting every time you eat or drink. Diarrhea. Get help right away if you: Become confused or cannot think clearly. Have trouble breathing. Have moderate or high ketone levels in your urine. Feel that your baby is moving around less than usual. Develop unusual discharge or bleeding from your vagina. Start having early (premature) contractions. Contractions may feel like a tightening in your lower abdomen. Have a severe headache. These symptoms may represent a serious problem that is an emergency. Do not wait to see if the symptoms will go away. Get medical help right away. Call your local emergency services (911 in the [place] Do not drive yourself to the hospital. Summary Gestational diabetes mellitus is a form of diabetes that some women get during pregnancy. It usually happens at weeks 24-28 of pregnancy and goes away after delivery. Treatment may include  eating a healthy diet and getting more physical activity. You may also be given insulin or other diabetes medicines. Contact a health care provider if your glucose levels reach 240 mg/dL (13.3 mmol/L) or higher. Get help right away if you become confused or cannot think clearly, or you feel that your baby is moving around less than usual. This information is not intended to replace advice given to you by your health care provider. Make sure you discuss any questions you have with your health care provider. Document Revised: 08/04/2021 Document Reviewed: 09/15/2019 Elsevier Patient Education  2024 Elsevier Inc.  Second Trimester of Pregnancy  The second trimester of pregnancy is from week 13 through week 27. This is months 4 through 6 of pregnancy. The second trimester is often a time when you feel your best. Your body has adjusted to being pregnant, and you begin to feel better physically. During the second trimester: Morning sickness has lessened or stopped completely. You may have more energy. You may have an increase in appetite. The second trimester is also a time when the unborn baby (fetus) is growing rapidly. At the end of the sixth month, the fetus may be up to 12 inches long and weigh about 1 pounds. You will likely begin to feel the baby move (quickening) between 16 and 20 weeks of pregnancy. Body changes during your second trimester Your body continues to go through many changes during your second trimester. The changes vary and generally return to   normal after the baby is born. Physical changes Your weight will continue to increase. You will notice your lower abdomen bulging out. You may begin to get stretch marks on your hips, abdomen, and breasts. Your breasts will continue to grow and to become tender. Dark spots or blotches (chloasma or mask of pregnancy) may develop on your face. A dark line from your belly button to the pubic area (linea nigra) may appear. You may have  changes in your hair. These can include thickening of your hair, rapid growth, and changes in texture. Some people also have hair loss during or after pregnancy, or hair that feels dry or thin. Health changes You may develop headaches. You may have heartburn. You may develop constipation. You may develop hemorrhoids or swollen, bulging veins (varicose veins). Your gums may bleed and may be sensitive to brushing and flossing. You may urinate more often because the fetus is pressing on your bladder. You may have back pain. This is caused by: Weight gain. Pregnancy hormones that are relaxing the joints in your pelvis. A shift in weight and the muscles that support your balance. Follow these instructions at home: Medicines Follow your health care provider's instructions regarding medicine use. Specific medicines may be either safe or unsafe to take during pregnancy. Do not take any medicines unless approved by your health care provider. Take a prenatal vitamin that contains at least 600 micrograms (mcg) of folic acid. Eating and drinking Eat a healthy diet that includes fresh fruits and vegetables, whole grains, good sources of protein such as meat, eggs, or tofu, and low-fat dairy products. Avoid raw meat and unpasteurized juice, milk, and cheese. These carry germs that can harm you and your baby. You may need to take these actions to prevent or treat constipation: Drink enough fluid to keep your urine pale yellow. Eat foods that are high in fiber, such as beans, whole grains, and fresh fruits and vegetables. Limit foods that are high in fat and processed sugars, such as fried or sweet foods. Activity Exercise only as directed by your health care provider. Most people can continue their usual exercise routine during pregnancy. Try to exercise for 30 minutes at least 5 days a week. Stop exercising if you develop contractions in your uterus. Stop exercising if you develop pain or cramping in the  lower abdomen or lower back. Avoid exercising if it is very hot or humid or if you are at a high altitude. Avoid heavy lifting. If you choose to, you may have sex unless your health care provider tells you not to. Relieving pain and discomfort Wear a supportive bra to prevent discomfort from breast tenderness. Take warm sitz baths to soothe any pain or discomfort caused by hemorrhoids. Use hemorrhoid cream if your health care provider approves. Rest with your legs raised (elevated) if you have leg cramps or low back pain. If you develop varicose veins: Wear support hose as told by your health care provider. Elevate your feet for 15 minutes, 3-4 times a day. Limit salt in your diet. Safety Wear your seat belt at all times when driving or riding in a car. Talk with your health care provider if someone is verbally or physically abusive to you. Lifestyle Do not use hot tubs, steam rooms, or saunas. Do not douche. Do not use tampons or scented sanitary pads. Avoid cat litter boxes and soil used by cats. These carry germs that can cause birth defects in the baby and possibly loss of the fetus   by miscarriage or stillbirth. Do not use herbal remedies, alcohol, illegal drugs, or medicines that are not approved by your health care provider. Chemicals in these products can harm your baby. Do not use any products that contain nicotine or tobacco, such as cigarettes, e-cigarettes, and chewing tobacco. If you need help quitting, ask your health care provider. General instructions During a routine prenatal visit, your health care provider will do a physical exam and other tests. He or she will also discuss your overall health. Keep all follow-up visits. This is important. Ask your health care provider for a referral to a local prenatal education class. Ask for help if you have counseling or nutritional needs during pregnancy. Your health care provider can offer advice or refer you to specialists for help  with various needs. Where to find more information American Pregnancy Association: americanpregnancy.org American College of Obstetricians and Gynecologists: acog.org/en/Womens%20Health/Pregnancy Office on Women's Health: womenshealth.gov/pregnancy Contact a health care provider if you have: A headache that does not go away when you take medicine. Vision changes or you see spots in front of your eyes. Mild pelvic cramps, pelvic pressure, or nagging pain in the abdominal area. Persistent nausea, vomiting, or diarrhea. A bad-smelling vaginal discharge or foul-smelling urine. Pain when you urinate. Sudden or extreme swelling of your face, hands, ankles, feet, or legs. A fever. Get help right away if you: Have fluid leaking from your vagina. Have spotting or bleeding from your vagina. Have severe abdominal cramping or pain. Have difficulty breathing. Have chest pain. Have fainting spells. Have not felt your baby move for the time period told by your health care provider. Have new or increased pain, swelling, or redness in an arm or leg. Summary The second trimester of pregnancy is from week 13 through week 27 (months 4 through 6). Do not use herbal remedies, alcohol, illegal drugs, or medicines that are not approved by your health care provider. Chemicals in these products can harm your baby. Exercise only as directed by your health care provider. Most people can continue their usual exercise routine during pregnancy. Keep all follow-up visits. This is important. This information is not intended to replace advice given to you by your health care provider. Make sure you discuss any questions you have with your health care provider. Document Revised: 09/17/2019 Document Reviewed: 07/24/2019 Elsevier Patient Education  2024 Elsevier Inc.  

## 2022-10-04 NOTE — Progress Notes (Signed)
   PRENATAL VISIT NOTE  Subjective:  Brooke Small is a 22 y.o. G1P0000 at [redacted]w[redacted]d being seen today for ongoing prenatal care.  She is currently monitored for the following issues for this high-risk pregnancy and has Supervision of high-risk pregnancy; Obesity affecting pregnancy; and Anxiety on their problem list.  Patient reports no complaints.  Contractions: Not present. Vag. Bleeding: None.  Movement: Absent. Denies leaking of fluid.   The following portions of the patient's history were reviewed and updated as appropriate: allergies, current medications, past family history, past medical history, past social history, past surgical history and problem list.   Objective:   Vitals:   10/04/22 1526  BP: 113/66  Pulse: 88  Weight: 262 lb (118.8 kg)   Total weight gain: -10 lb (-4.536 kg) Fetal Status: Fetal Heart Rate (bpm): 145   Movement: Absent      General:  Alert, oriented and cooperative. Patient is in no acute distress.  Skin: Skin is warm and dry. No rash noted.   Cardiovascular: Normal heart rate noted  Respiratory: Normal respiratory effort, no problems with respiration noted  Abdomen: Soft, gravid, appropriate for gestational age.  Pain/Pressure: Absent     Pelvic: Cervical exam deferred        Extremities: Normal range of motion.     Mental Status: Normal mood and affect. Normal behavior. Normal judgment and thought content.   Assessment and Plan:  Pregnancy: G1P0000 at [redacted]w[redacted]d 1. Supervision of high risk pregnancy in second trimester - Glucose Tolerance, 1 HR (50g); Future - Ambulatory referral to Anesthesiology - Protein / creatinine ratio, urine - Comprehensive metabolic panel; Future - POC Urinalysis Dipstick OB  2. [redacted] weeks gestation of pregnancy  3. Obesity affecting pregnancy in second trimester, unspecified obesity type - Glucose Tolerance, 1 HR (50g); Future - Ambulatory referral to Anesthesiology - Protein / creatinine ratio, urine - Comprehensive  metabolic panel; Future  Has not yet started daily ASA, reviewed recommendation & encouraged to start, prescription resent Anatomy ultrasound with MFM already scheduled. Early 1h recommended, will have lab only visit to complete. Brooke Small's mother reports pre-eclampsia in her first pregnancy requiring early delivery, will add CMP to early 1h & collect UP/C today for baseline. Anesthesia consult ordered due to BMI>45, reviewed possibility of needing to delivery at higher level of care if BMI reaches 60. Preterm labor symptoms and general obstetric precautions including but not limited to vaginal bleeding, contractions, leaking of fluid and fetal movement were reviewed in detail with the patient. Please refer to After Visit Summary for other counseling recommendations.   Return in 4 weeks (on 11/01/2022) for ROB.  Future Appointments  Date Time Provider Department Center  10/10/2022 10:00 AM [facility] None  10/30/2022  1:00 PM [facility]  11/01/2022  3:35 PM Free, Lindalou Hose, CNM [facility] None    Dominica Severin, CNM

## 2022-10-05 LAB — PROTEIN / CREATININE RATIO, URINE
Creatinine, Urine: 70.3 mg/dL
Protein, Ur: 5.6 mg/dL
Protein/Creat Ratio: 80 mg/g creat (ref 0–200)

## 2022-10-10 ENCOUNTER — Other Ambulatory Visit: Payer: 59

## 2022-10-10 DIAGNOSIS — O99212 Obesity complicating pregnancy, second trimester: Secondary | ICD-10-CM | POA: Diagnosis not present

## 2022-10-10 DIAGNOSIS — O0992 Supervision of high risk pregnancy, unspecified, second trimester: Secondary | ICD-10-CM

## 2022-10-10 DIAGNOSIS — Z131 Encounter for screening for diabetes mellitus: Secondary | ICD-10-CM

## 2022-10-11 LAB — COMPREHENSIVE METABOLIC PANEL
ALT: 16 IU/L (ref 0–32)
AST: 13 IU/L (ref 0–40)
Albumin: 3.8 g/dL — ABNORMAL LOW (ref 4.0–5.0)
Alkaline Phosphatase: 54 IU/L (ref 42–106)
BUN/Creatinine Ratio: 10 (ref 9–23)
BUN: 5 mg/dL — ABNORMAL LOW (ref 6–20)
Bilirubin Total: 0.2 mg/dL (ref 0.0–1.2)
CO2: 21 mmol/L (ref 20–29)
Calcium: 8.9 mg/dL (ref 8.7–10.2)
Chloride: 103 mmol/L (ref 96–106)
Creatinine, Ser: 0.48 mg/dL — ABNORMAL LOW (ref 0.57–1.00)
Globulin, Total: 2.2 g/dL (ref 1.5–4.5)
Glucose: 135 mg/dL — ABNORMAL HIGH (ref 70–99)
Potassium: 4.1 mmol/L (ref 3.5–5.2)
Sodium: 139 mmol/L (ref 134–144)
Total Protein: 6 g/dL (ref 6.0–8.5)
eGFR: 140 mL/min/{1.73_m2} (ref >59)

## 2022-10-11 LAB — GLUCOSE, 1 HOUR GESTATIONAL: Gestational Diabetes Screen: 138 mg/dL (ref 70–139)

## 2022-10-24 ENCOUNTER — Encounter
Admission: RE | Admit: 2022-10-24 | Discharge: 2022-10-24 | Disposition: A | Discharge status: Home / Self Care | Payer: 59 | Source: Ambulatory Visit

## 2022-10-24 NOTE — Progress Notes (Signed)
[facility] Anesthesia Consultation  VIRTUE KLINT ZOX:[id number] DOB: [birth year] DOA: 10/24/2022 PCP: Oneita Hurt, No   Requesting physician: [facility] Date of consultation: 10/24/2022 Reason for consultation: Obesity during pregnancy  CHIEF COMPLAINT:  Obesity during pregnancy  HISTORY OF PRESENT ILLNESS: Brooke Small  is a 22 y.o. female who is [redacted] weeks pregnant and with a known history of asthma as a child and grade III obesity with a BMI today of 43.1 who is presenting for evaluation for delivery at [facility].  PAST MEDICAL HISTORY:   Past Medical History:  Diagnosis Date   Asthma     PAST SURGICAL HISTORY:  Past Surgical History:  Procedure Laterality Date   TONSILLECTOMY AND ADENOIDECTOMY      SOCIAL HISTORY:  Social History   Tobacco Use   Smoking status: Never   Smokeless tobacco: Never  Substance Use Topics   Alcohol use: Never    FAMILY HISTORY:  Family History  Problem Relation Age of Onset   Healthy Father    Healthy Sister    Healthy Brother    Heart attack Maternal Grandmother    Hypertension Maternal Grandmother    Diabetes Maternal Grandmother    Healthy Maternal Grandfather    Diabetes Paternal Grandfather    Liver disease Paternal Grandfather        failure   Heart attack Paternal Grandfather     DRUG ALLERGIES: No Known Allergies  REVIEW OF SYSTEMS:   RESPIRATORY: No cough, shortness of breath, wheezing.  CARDIOVASCULAR: No chest pain, orthopnea, edema.  HEMATOLOGY: No anemia, easy bruising or bleeding SKIN: No rash or lesion. NEUROLOGIC: No tingling, numbness, weakness.  PSYCHIATRY: No anxiety or depression.   MEDICATIONS AT HOME:  Prior to Admission medications   Medication Sig Start Date End Date Taking? Authorizing Provider  aspirin EC 81 MG tablet Take 1 tablet (81 mg total) by mouth daily. Take after 12 weeks for prevention of preeclampssia later in pregnancy 10/04/22   Dominica Severin, CNM  Prenatal Vit-Fe Fumarate-FA (MULTIVITAMIN-PRENATAL) 27-0.8 MG TABS tablet Take 1 tablet by mouth daily at 12 noon. 07/31/22 11/08/22  Federico Flake, MD      PHYSICAL EXAMINATION:   VITAL SIGNS: Last menstrual period 06/15/2022.  GENERAL:  22 y.o.-year-old patient no acute distress.  HEENT: Head atraumatic, normocephalic. Oropharynx and nasopharynx clear. MP 2, TM distance <3 cm, small mouth opening, and very large neck LUNGS: No use of accessory muscles of respiration.   EXTREMITIES: No pedal edema, cyanosis, or clubbing.  NEUROLOGIC: normal gait PSYCHIATRIC: The patient is alert and oriented x 3.  SKIN: No obvious rash, lesion, or ulcer.    IMPRESSION AND PLAN:   Brooke Small  is a 22 y.o. female presenting with obesity during pregnancy. BMI is currently 43.1 at [redacted] weeks gestation.   Patient has a tenuous airway currently and I could see this progressing to be a very difficult airway in the future if the patient gains more weight or swelling becomes a problem.  Patient has a small mouth opening, very large neck circumference and short thyromental distance.  Given these concerns she will need to be seen again for repeat evaluation at 35-36 weeks by anesthesia to determine whether there is a high risk of complications of anesthesia for which we would recommend transfer of OB care to a facility with a higher maternal level of care designation.  At this point I would not be surprised if we were to recommend transfer to  a higher level of care in the future.  Given that I was not confident that she would be delivering at [facility] we did not discuss the epidural risks/benefits or any other pain management techniques at this time.  If she is deemed okay to deliver here at her next appointment then we can have that discussion at that time.  Thank you for this consult.

## 2022-10-30 ENCOUNTER — Ambulatory Visit: Payer: 59 | Attending: Maternal & Fetal Medicine

## 2022-10-30 ENCOUNTER — Other Ambulatory Visit: Payer: Self-pay

## 2022-10-30 VITALS — BP 131/72 | HR 81 | Temp 98.0°F | Ht 65.0 in | Wt 264.0 lb

## 2022-10-30 DIAGNOSIS — O43192 Other malformation of placenta, second trimester: Secondary | ICD-10-CM | POA: Insufficient documentation

## 2022-10-30 DIAGNOSIS — O09899 Supervision of other high risk pregnancies, unspecified trimester: Secondary | ICD-10-CM

## 2022-10-30 DIAGNOSIS — E669 Obesity, unspecified: Secondary | ICD-10-CM

## 2022-10-30 DIAGNOSIS — Z3A19 19 weeks gestation of pregnancy: Secondary | ICD-10-CM | POA: Insufficient documentation

## 2022-10-30 DIAGNOSIS — Z2839 Supervision of other high risk pregnancies, unspecified trimester: Secondary | ICD-10-CM

## 2022-10-30 DIAGNOSIS — O99212 Obesity complicating pregnancy, second trimester: Secondary | ICD-10-CM | POA: Diagnosis not present

## 2022-10-30 DIAGNOSIS — O99211 Obesity complicating pregnancy, first trimester: Secondary | ICD-10-CM | POA: Diagnosis not present

## 2022-10-30 DIAGNOSIS — Z3689 Encounter for other specified antenatal screening: Secondary | ICD-10-CM | POA: Insufficient documentation

## 2022-10-30 DIAGNOSIS — O0992 Supervision of high risk pregnancy, unspecified, second trimester: Secondary | ICD-10-CM

## 2022-10-30 HISTORY — DX: Supervision of other high risk pregnancies, unspecified trimester: Z28.39

## 2022-10-30 HISTORY — DX: Supervision of other high risk pregnancies, unspecified trimester: O09.899

## 2022-11-01 ENCOUNTER — Ambulatory Visit (INDEPENDENT_AMBULATORY_CARE_PROVIDER_SITE_OTHER): Payer: 59

## 2022-11-01 VITALS — BP 122/74 | Wt 263.0 lb

## 2022-11-01 DIAGNOSIS — O99212 Obesity complicating pregnancy, second trimester: Secondary | ICD-10-CM

## 2022-11-01 DIAGNOSIS — E669 Obesity, unspecified: Secondary | ICD-10-CM

## 2022-11-01 DIAGNOSIS — O43192 Other malformation of placenta, second trimester: Secondary | ICD-10-CM

## 2022-11-01 DIAGNOSIS — Z3A19 19 weeks gestation of pregnancy: Secondary | ICD-10-CM

## 2022-11-01 DIAGNOSIS — O0992 Supervision of high risk pregnancy, unspecified, second trimester: Secondary | ICD-10-CM

## 2022-11-01 DIAGNOSIS — O43199 Other malformation of placenta, unspecified trimester: Secondary | ICD-10-CM

## 2022-11-01 DIAGNOSIS — O09899 Supervision of other high risk pregnancies, unspecified trimester: Secondary | ICD-10-CM

## 2022-11-01 DIAGNOSIS — Z2839 Other underimmunization status: Secondary | ICD-10-CM

## 2022-11-01 NOTE — Progress Notes (Signed)
    Return Prenatal Note   Assessment/Plan   Plan  22 y.o. G1P0000 at [redacted]w[redacted]d presents for follow-up OB visit. Reviewed prenatal record including previous visit note.  Marginal insertion of umbilical cord affecting management of mother Marginal cord insertion noted on anatomy ultrasound on 7/8. Some suboptimal views, follow up ultrasound scheduled.  Obesity affecting pregnancy Anesthesia consult on 7/2 notes tenuous airway and concerns for ability to deliver at [facility] with continued weight gain in pregnancy. Plan for follow up anesthesia consult in 3rd trimester. Reviewed the possibility of not being able to deliver at [facility] and option to consider early transfer to practice that delivers at hospital with higher level anesthesia support or continue with AOB and transfer in third trimester after follow up anesthesia consult if still recommended at that time.   Supervision of high-risk pregnancy Reviewed red flag warning signs anticipatory guidance for upcoming prenatal care.    No orders of the defined types were placed in this encounter.  Return in about 4 weeks (around 11/29/2022) for ROB.   Future Appointments  Date Time Provider Department Center  11/27/2022  3:00 PM [facility]  12/01/2022  3:55 PM Dominic, Courtney Heys, CNM [facility] None  12/27/2022  3:00 PM [facility]    For next visit:  continue with routine prenatal care     Subjective   22 y.o. G1P0000 at [redacted]w[redacted]d presents for this follow-up prenatal visit.  Patient has no concerns today. Patient reports: Movement: Present Contractions: Not present  Objective   Flow sheet Vitals: BP: 122/74 Fundal Height: 19 cm Fetal Heart Rate (bpm): 145 Total weight gain: -9 lb (-4.082 kg)  General Appearance  No acute distress, well appearing, and well nourished Pulmonary   Normal work of breathing Neurologic   Alert and oriented to person, place, and time Psychiatric   Mood and affect within  normal limits  Lindalou Hose Ulah Olmo, CNM  07/10/244:01 PM

## 2022-11-01 NOTE — Assessment & Plan Note (Addendum)
Marginal cord insertion noted on anatomy ultrasound on 7/8. Some suboptimal views, follow up ultrasound scheduled.

## 2022-11-01 NOTE — Assessment & Plan Note (Signed)
Reviewed red flag warning signs anticipatory guidance for upcoming prenatal care.

## 2022-11-01 NOTE — Assessment & Plan Note (Addendum)
Anesthesia consult on 7/2 notes tenuous airway and concerns for ability to deliver at [facility] with continued weight gain in pregnancy. Plan for follow up anesthesia consult in 3rd trimester. Reviewed the possibility of not being able to deliver at [facility] and option to consider early transfer to practice that delivers at hospital with higher level anesthesia support or continue with AOB and transfer in third trimester after follow up anesthesia consult if still recommended at that time.

## 2022-11-22 ENCOUNTER — Other Ambulatory Visit: Payer: Self-pay | Admitting: Maternal & Fetal Medicine

## 2022-11-22 DIAGNOSIS — O43193 Other malformation of placenta, third trimester: Secondary | ICD-10-CM

## 2022-11-22 DIAGNOSIS — O99212 Obesity complicating pregnancy, second trimester: Secondary | ICD-10-CM

## 2022-11-27 ENCOUNTER — Ambulatory Visit: Payer: 59 | Attending: Maternal & Fetal Medicine

## 2022-11-27 ENCOUNTER — Other Ambulatory Visit: Payer: Self-pay | Admitting: Maternal & Fetal Medicine

## 2022-11-27 ENCOUNTER — Other Ambulatory Visit: Payer: Self-pay

## 2022-11-27 VITALS — BP 136/65 | HR 81 | Ht 65.0 in | Wt 264.0 lb

## 2022-11-27 DIAGNOSIS — O0992 Supervision of high risk pregnancy, unspecified, second trimester: Secondary | ICD-10-CM

## 2022-11-27 DIAGNOSIS — O99212 Obesity complicating pregnancy, second trimester: Secondary | ICD-10-CM | POA: Insufficient documentation

## 2022-11-27 DIAGNOSIS — O43192 Other malformation of placenta, second trimester: Secondary | ICD-10-CM | POA: Diagnosis not present

## 2022-11-27 DIAGNOSIS — Z362 Encounter for other antenatal screening follow-up: Secondary | ICD-10-CM | POA: Diagnosis not present

## 2022-11-27 DIAGNOSIS — O43193 Other malformation of placenta, third trimester: Secondary | ICD-10-CM

## 2022-11-27 DIAGNOSIS — Z3A23 23 weeks gestation of pregnancy: Secondary | ICD-10-CM | POA: Insufficient documentation

## 2022-11-27 DIAGNOSIS — O09899 Supervision of other high risk pregnancies, unspecified trimester: Secondary | ICD-10-CM

## 2022-11-27 DIAGNOSIS — O365920 Maternal care for other known or suspected poor fetal growth, second trimester, not applicable or unspecified: Secondary | ICD-10-CM | POA: Diagnosis not present

## 2022-11-27 DIAGNOSIS — E669 Obesity, unspecified: Secondary | ICD-10-CM

## 2022-11-27 DIAGNOSIS — O43199 Other malformation of placenta, unspecified trimester: Secondary | ICD-10-CM

## 2022-12-01 ENCOUNTER — Encounter: Payer: 59 | Admitting: Licensed Practical Nurse

## 2022-12-06 ENCOUNTER — Other Ambulatory Visit: Payer: Self-pay

## 2022-12-06 DIAGNOSIS — O43199 Other malformation of placenta, unspecified trimester: Secondary | ICD-10-CM

## 2022-12-06 DIAGNOSIS — O99212 Obesity complicating pregnancy, second trimester: Secondary | ICD-10-CM

## 2022-12-06 DIAGNOSIS — O365990 Maternal care for other known or suspected poor fetal growth, unspecified trimester, not applicable or unspecified: Secondary | ICD-10-CM

## 2022-12-08 ENCOUNTER — Ambulatory Visit: Payer: 59 | Admitting: Obstetrics

## 2022-12-08 VITALS — BP 97/57 | HR 100 | Wt 263.0 lb

## 2022-12-08 DIAGNOSIS — Z131 Encounter for screening for diabetes mellitus: Secondary | ICD-10-CM

## 2022-12-08 DIAGNOSIS — O0992 Supervision of high risk pregnancy, unspecified, second trimester: Secondary | ICD-10-CM

## 2022-12-08 DIAGNOSIS — Z113 Encounter for screening for infections with a predominantly sexual mode of transmission: Secondary | ICD-10-CM

## 2022-12-08 DIAGNOSIS — Z130 Encounter for screening for diseases of the blood and blood-forming organs and certain disorders involving the immune mechanism: Secondary | ICD-10-CM

## 2022-12-08 DIAGNOSIS — Z3A24 24 weeks gestation of pregnancy: Secondary | ICD-10-CM

## 2022-12-08 LAB — POCT URINALYSIS DIPSTICK OB
Bilirubin, UA: NEGATIVE
Blood, UA: NEGATIVE
Glucose, UA: NEGATIVE
Ketones, UA: NEGATIVE
Leukocytes, UA: NEGATIVE
Nitrite, UA: NEGATIVE
POC,PROTEIN,UA: NEGATIVE
Urobilinogen, UA: 0.2 E.U./dL
pH, UA: 5 (ref 5.0–8.0)

## 2022-12-08 NOTE — Progress Notes (Signed)
Routine Prenatal Care Visit  Subjective  Brooke Small is a 22 y.o. G1P0000 at [redacted]w[redacted]d being seen today for ongoing prenatal care.  She is currently monitored for the following issues for this high-risk pregnancy and has Supervision of high-risk pregnancy; Obesity affecting pregnancy; Anxiety; Rubella non-immune status, antepartum; Maternal varicella, non-immune; and Marginal insertion of umbilical cord affecting management of mother on their problem list.  ----------------------------------------------------------------------------------- Patient reports no complaints.   Contractions: Not present. Vag. Bleeding: None.  Movement: Present. Leaking Fluid denies.  ----------------------------------------------------------------------------------- The following portions of the patient's history were reviewed and updated as appropriate: allergies, current medications, past family history, past medical history, past social history, past surgical history and problem list. Problem list updated.  Objective  Blood pressure (!) 97/57, pulse 100, weight 263 lb (119.3 kg), last menstrual period 06/15/2022. Pregravid weight 272 lb (123.4 kg) Total Weight Gain -9 lb (-4.082 kg) Urinalysis: Urine Protein    Urine Glucose    Fetal Status:     Movement: Present     General:  Alert, oriented and cooperative. Patient is in no acute distress.  Skin: Skin is warm and dry. No rash noted.   Cardiovascular: Normal heart rate noted  Respiratory: Normal respiratory effort, no problems with respiration noted  Abdomen: Soft, gravid, appropriate for gestational age. Pain/Pressure: Present     Pelvic:  Cervical exam deferred        Extremities: Normal range of motion.     Mental Status: Normal mood and affect. Normal behavior. Normal judgment and thought content.   Assessment   22 y.o. G1P0000 at [redacted]w[redacted]d by  03/25/2023, by Ultrasound presenting for routine prenatal visit  Plan   first Problems (from 08/17/22 to  present)     Problem Noted Resolved   Marginal insertion of umbilical cord affecting management of mother 11/01/2022 by Burney Gauze, CNM No   Overview Signed 11/01/2022  3:36 PM by Burney Gauze, CNM    Noted on anatomy ultrasound      Rubella non-immune status, antepartum 10/30/2022 by Free, Lindalou Hose, CNM No   Maternal varicella, non-immune 10/30/2022 by Burney Gauze, CNM No   Supervision of high-risk pregnancy 08/17/2022 by Loran Senters, CMA No   Overview Addendum 12/08/2022  2:35 PM by Mirna Mires, CNM     Clinical Staff Provider  Office Location  [facility] Dating  By LMP c/w 10w u/s  Language  English Anatomy US  Normal anatomy female  Flu Vaccine  UTD Genetic Screen  NIPS:   TDaP vaccine   ACHD Hgb A1C or  GTT Early : Hgb A1C 5.7 Third trimester :   Covid One dose   LAB RESULTS   Rhogam  A/Positive/-- (05/09 1630)  Blood Type A/Positive/-- (05/09 1630)   Feeding Plan breast Antibody Negative (05/09 1630)  Contraception undecided Rubella <0.90 (05/09 1630)  Circumcision yes RPR Non Reactive (05/09 1630)   Pediatrician  undecided HBsAg Negative (05/09 1630)   Support Person Raiden HIV Non Reactive (05/09 1630)  Prenatal Classes  Varicella     GBS  (For PCN allergy, check sensitivities)   BTL Consent  Hep C Non Reactive (05/09 1630)   VBAC Consent NA Pap No results found for: "DIAGPAP"    Hgb Electro      CF      SMA         Obesity BMI 45 at NOB Early Hgb A1C 5.7 prediabetes- referral to Lifestyles sent Low dose ASA start at  12 weeks Anesthesia consult Growth scans NSTs           Preterm labor symptoms and general obstetric precautions including but not limited to vaginal bleeding, contractions, leaking of fluid and fetal movement were reviewed in detail with the patient. Please refer to After Visit Summary for other counseling recommendations.   Return in about 2 weeks (around 12/22/2022) for return OB, 28 week labs.  Mirna Mires, CNM  12/08/2022  2:36 PM

## 2022-12-11 ENCOUNTER — Ambulatory Visit: Payer: 59

## 2022-12-12 ENCOUNTER — Encounter: Payer: Self-pay | Admitting: Obstetrics

## 2022-12-13 ENCOUNTER — Other Ambulatory Visit: Payer: Self-pay

## 2022-12-13 DIAGNOSIS — O99212 Obesity complicating pregnancy, second trimester: Secondary | ICD-10-CM

## 2022-12-13 DIAGNOSIS — O43199 Other malformation of placenta, unspecified trimester: Secondary | ICD-10-CM

## 2022-12-13 NOTE — Telephone Encounter (Signed)
Hi Brooke Small We do not treat viruses with antibiotics, so if you are still having sinus issues, you can take an Over the Counter cold and sinus medication, like Tylenol cold and sinus, or cold and Flu. Just treat the symptoms. Another option is Dayquil capsules.  Mirna Mires, CNM  12/13/2022 5:41 PM

## 2022-12-18 ENCOUNTER — Ambulatory Visit: Payer: 59 | Attending: Obstetrics and Gynecology

## 2022-12-18 ENCOUNTER — Other Ambulatory Visit: Payer: Self-pay

## 2022-12-18 VITALS — BP 117/85 | HR 87 | Temp 97.8°F | Ht 65.0 in | Wt 259.5 lb

## 2022-12-18 DIAGNOSIS — O99212 Obesity complicating pregnancy, second trimester: Secondary | ICD-10-CM

## 2022-12-18 DIAGNOSIS — E669 Obesity, unspecified: Secondary | ICD-10-CM

## 2022-12-18 DIAGNOSIS — O0992 Supervision of high risk pregnancy, unspecified, second trimester: Secondary | ICD-10-CM

## 2022-12-18 DIAGNOSIS — Z2839 Other underimmunization status: Secondary | ICD-10-CM

## 2022-12-18 DIAGNOSIS — Z3A26 26 weeks gestation of pregnancy: Secondary | ICD-10-CM

## 2022-12-18 DIAGNOSIS — O09899 Supervision of other high risk pregnancies, unspecified trimester: Secondary | ICD-10-CM

## 2022-12-18 DIAGNOSIS — O99213 Obesity complicating pregnancy, third trimester: Secondary | ICD-10-CM | POA: Diagnosis not present

## 2022-12-18 DIAGNOSIS — Z362 Encounter for other antenatal screening follow-up: Secondary | ICD-10-CM | POA: Insufficient documentation

## 2022-12-18 DIAGNOSIS — O43192 Other malformation of placenta, second trimester: Secondary | ICD-10-CM

## 2022-12-18 DIAGNOSIS — O365990 Maternal care for other known or suspected poor fetal growth, unspecified trimester, not applicable or unspecified: Secondary | ICD-10-CM

## 2022-12-18 DIAGNOSIS — O365920 Maternal care for other known or suspected poor fetal growth, second trimester, not applicable or unspecified: Secondary | ICD-10-CM

## 2022-12-18 DIAGNOSIS — O43199 Other malformation of placenta, unspecified trimester: Secondary | ICD-10-CM

## 2022-12-19 ENCOUNTER — Telehealth: Payer: Self-pay

## 2022-12-20 ENCOUNTER — Other Ambulatory Visit: Payer: Self-pay | Admitting: *Deleted

## 2022-12-20 DIAGNOSIS — O99213 Obesity complicating pregnancy, third trimester: Secondary | ICD-10-CM

## 2022-12-20 DIAGNOSIS — O43193 Other malformation of placenta, third trimester: Secondary | ICD-10-CM

## 2022-12-20 DIAGNOSIS — O365930 Maternal care for other known or suspected poor fetal growth, third trimester, not applicable or unspecified: Secondary | ICD-10-CM

## 2022-12-22 ENCOUNTER — Other Ambulatory Visit: Payer: Self-pay

## 2022-12-22 ENCOUNTER — Other Ambulatory Visit: Payer: 59

## 2022-12-22 ENCOUNTER — Other Ambulatory Visit: Payer: Self-pay | Admitting: *Deleted

## 2022-12-22 ENCOUNTER — Ambulatory Visit: Payer: 59 | Admitting: Certified Nurse Midwife

## 2022-12-22 VITALS — BP 110/77 | HR 72 | Wt 262.0 lb

## 2022-12-22 DIAGNOSIS — Z3A24 24 weeks gestation of pregnancy: Secondary | ICD-10-CM

## 2022-12-22 DIAGNOSIS — Z131 Encounter for screening for diabetes mellitus: Secondary | ICD-10-CM

## 2022-12-22 DIAGNOSIS — O0991 Supervision of high risk pregnancy, unspecified, first trimester: Secondary | ICD-10-CM

## 2022-12-22 DIAGNOSIS — Z113 Encounter for screening for infections with a predominantly sexual mode of transmission: Secondary | ICD-10-CM

## 2022-12-22 DIAGNOSIS — O0992 Supervision of high risk pregnancy, unspecified, second trimester: Secondary | ICD-10-CM | POA: Diagnosis not present

## 2022-12-22 DIAGNOSIS — O365992 Maternal care for other known or suspected poor fetal growth, unspecified trimester, fetus 2: Secondary | ICD-10-CM

## 2022-12-22 DIAGNOSIS — Z114 Encounter for screening for human immunodeficiency virus [HIV]: Secondary | ICD-10-CM

## 2022-12-22 DIAGNOSIS — O99213 Obesity complicating pregnancy, third trimester: Secondary | ICD-10-CM

## 2022-12-22 DIAGNOSIS — O43192 Other malformation of placenta, second trimester: Secondary | ICD-10-CM

## 2022-12-22 DIAGNOSIS — Z130 Encounter for screening for diseases of the blood and blood-forming organs and certain disorders involving the immune mechanism: Secondary | ICD-10-CM

## 2022-12-22 DIAGNOSIS — O365990 Maternal care for other known or suspected poor fetal growth, unspecified trimester, not applicable or unspecified: Secondary | ICD-10-CM

## 2022-12-22 DIAGNOSIS — O365920 Maternal care for other known or suspected poor fetal growth, second trimester, not applicable or unspecified: Secondary | ICD-10-CM

## 2022-12-22 DIAGNOSIS — O99212 Obesity complicating pregnancy, second trimester: Secondary | ICD-10-CM

## 2022-12-22 DIAGNOSIS — O43199 Other malformation of placenta, unspecified trimester: Secondary | ICD-10-CM

## 2022-12-22 DIAGNOSIS — Z3A26 26 weeks gestation of pregnancy: Secondary | ICD-10-CM

## 2022-12-22 NOTE — Progress Notes (Unsigned)
    Return Prenatal Note   Subjective   22 y.o. G1P0000 at [redacted]w[redacted]d presents for this follow-up prenatal visit.  Patient *** Patient reports: Movement: Present Contractions: Irritability  Objective   Flow sheet Vitals: Pulse Rate: 72 BP: 110/77 Total weight gain: -10 lb (-4.536 kg)  General Appearance  No acute distress, well appearing, and well nourished Pulmonary   Normal work of breathing Neurologic   Alert and oriented to person, place, and time Psychiatric   Mood and affect within normal limits  Assessment/Plan   Plan  22 y.o. G1P0000 at [redacted]w[redacted]d presents for follow-up OB visit. Reviewed prenatal record including previous visit note. 1. Supervision of high risk pregnancy in first trimester  2. Screening examination for venereal disease  3. Screening for human immunodeficiency virus  4. Screening for diabetes mellitus  5. [redacted] weeks gestation of pregnancy    No orders of the defined types were placed in this encounter.  Return in 2 weeks (on 01/05/2023) for ROB.   Future Appointments  Date Time Provider Department Center  12/27/2022  3:00 PM [facility]  01/03/2023  9:30 AM [facility] NURSE [facility]  01/03/2023  9:45 AM [facility] NST [facility]  01/03/2023 10:30 AM [facility] US6 [facility]  01/09/2023  9:15 AM [facility] NURSE [facility]  01/09/2023  9:30 AM [facility] US6 [facility]  01/09/2023 10:45 AM [facility] NST [facility]  01/16/2023  1:00 PM [facility] NURSE [facility]  01/16/2023  1:15 PM [facility] NST [facility]  01/16/2023  2:30 PM [facility] US2 [facility]    For next visit:  {[email or web address]:[id number]}     Dominica Severin, CNM  12/21/2409:34 AM

## 2022-12-22 NOTE — Patient Instructions (Signed)
Gestational Diabetes Mellitus, Diagnosis Gestational diabetes mellitus, or gestational diabetes, is diabetes that some people get when pregnant. This condition usually happens at 24-28 weeks of pregnancy.  People with gestational diabetes have high blood sugar. If you do not get treated for this condition, it may cause problems for you and your baby. It goes away after you give birth but can happen again the next time you become pregnant. What are the causes? This condition is caused by changes in your body when you're pregnant. When these changes happen: The pancreas may not make enough insulin. The body may not use insulin in the right way. This is called insulin resistance. Insulin helps your body use sugar for energy. If your body doesn't have enough insulin or can't use the insulin that it has, extra sugars stay in your blood. This leads to high blood sugar (hyperglycemia). What increases the risk? Being older than age 79 when pregnant. Too much body weight. Having polycystic ovary syndrome (PCOS). Having someone with diabetes in your family. Having had this condition in the past. Being pregnant with more than one baby. What are the signs or symptoms? You may not have any symptoms. If you do have symptoms, they may include: Being thirsty often. Being hungry often. Needing to pee more often. These symptoms can be missed because they're similar to other symptoms of pregnancy. How is this diagnosed? This condition may be diagnosed based on your blood sugar level and how your body responds to glucose. This may be checked with an oral glucose tolerance test (OGTT). The test may be done: Early in pregnancy if you have risk factors. At 24-28 weeks of your pregnancy. How is this treated? To treat this condition, you may be told to: Eat a healthy diet. Get more exercise. Check your blood sugar often. Your health care provider will tell you what your target is. Take insulin and other  medicines. These are taken if needed. Work with a diabetes expert. Follow these instructions at home: Learn about your diabetes Ask your provider: How often should I check my blood sugar? Where do I get the equipment? What medicines do I need? When should I take them? Do I need to meet with an educator? Who can I call if I have questions? Where can I find a support group? General instructions Take medicines only as told by your provider. Stay at a healthy weight. Drink enough fluid to keep your pee (urine) pale yellow. Check your pee for ketones when sick and as told. Ketones in your pee is a sign that your body is using fat for energy because it's not making enough insulin. Wear an alert bracelet or carry a card that shows you have this condition. Keep all follow-up visits. Your provider needs to check your health and your baby's growth. Where to find more information Gestational Diabetes: American Diabetes Association (ADA): diabetes.org Gestational Diabetes and Pregnancy: Centers for Disease Control and Prevention (CDC): TonerPromos.no American Pregnancy Association: americanpregnancy.org [place] MyPlate: WrestlingReporter.dk Contact a health care provider if:  Your blood sugar is above your target for two tests in a row. You have a high blood sugar level and you also have ketones in your pee. You have been sick or have had a fever for 2 days or more and aren't getting better. You have any of these problems for more than 6 hours: You can't eat or drink. You vomit or feel like you may vomit. You have watery poop (diarrhea). Get help right away if:  You become confused or cannot think clearly. You have trouble breathing. You have chest pain. Your baby is moving less than usual. You have unusual discharge or bleeding from your vagina. You have cramping in your belly or have pain in your hips or lower back. You have symptoms of high blood pressure or preeclampsia. These include: A severe,  throbbing headache that doesn't go away. Sudden or extreme swelling of your face, hands, legs, or feet. Vision problems, such as: Seeing spots. Blurry vision. Sensitivity to light. These symptoms may be an emergency. Get help right away. Call 911. Do not wait to see if the symptoms will go away. Do not drive yourself to the hospital. This information is not intended to replace advice given to you by your health care provider. Make sure you discuss any questions you have with your health care provider. Document Revised: 07/21/2022 Document Reviewed: 07/21/2022 Elsevier Patient Education  2024 Elsevier Inc.  Second Trimester of Pregnancy  The second trimester of pregnancy is from week 13 through week 27. This is months 4 through 6 of pregnancy. The second trimester is often a time when you feel your best. Your body has adjusted to being pregnant, and you begin to feel better physically. During the second trimester: Morning sickness has lessened or stopped completely. You may have more energy. You may have an increase in appetite. The second trimester is also a time when the unborn baby (fetus) is growing rapidly. At the end of the sixth month, the fetus may be up to 12 inches long and weigh about 1 pounds. You will likely begin to feel the baby move (quickening) between 16 and 20 weeks of pregnancy. Body changes during your second trimester Your body continues to go through many changes during your second trimester. The changes vary and generally return to normal after the baby is born. Physical changes Your weight will continue to increase. You will notice your lower abdomen bulging out. You may begin to get stretch marks on your hips, abdomen, and breasts. Your breasts will continue to grow and to become tender. Dark spots or blotches (chloasma or mask of pregnancy) may develop on your face. A dark line from your belly button to the pubic area (linea nigra) may appear. You may have  changes in your hair. These can include thickening of your hair, rapid growth, and changes in texture. Some people also have hair loss during or after pregnancy, or hair that feels dry or thin. Health changes You may develop headaches. You may have heartburn. You may develop constipation. You may develop hemorrhoids or swollen, bulging veins (varicose veins). Your gums may bleed and may be sensitive to brushing and flossing. You may urinate more often because the fetus is pressing on your bladder. You may have back pain. This is caused by: Weight gain. Pregnancy hormones that are relaxing the joints in your pelvis. A shift in weight and the muscles that support your balance. Follow these instructions at home: Medicines Follow your health care provider's instructions regarding medicine use. Specific medicines may be either safe or unsafe to take during pregnancy. Do not take any medicines unless approved by your health care provider. Take a prenatal vitamin that contains at least 600 micrograms (mcg) of folic acid. Eating and drinking Eat a healthy diet that includes fresh fruits and vegetables, whole grains, good sources of protein such as meat, eggs, or tofu, and low-fat dairy products. Avoid raw meat and unpasteurized juice, milk, and cheese. These carry germs  that can harm you and your baby. You may need to take these actions to prevent or treat constipation: Drink enough fluid to keep your urine pale yellow. Eat foods that are high in fiber, such as beans, whole grains, and fresh fruits and vegetables. Limit foods that are high in fat and processed sugars, such as fried or sweet foods. Activity Exercise only as directed by your health care provider. Most people can continue their usual exercise routine during pregnancy. Try to exercise for 30 minutes at least 5 days a week. Stop exercising if you develop contractions in your uterus. Stop exercising if you develop pain or cramping in the  lower abdomen or lower back. Avoid exercising if it is very hot or humid or if you are at a high altitude. Avoid heavy lifting. If you choose to, you may have sex unless your health care provider tells you not to. Relieving pain and discomfort Wear a supportive bra to prevent discomfort from breast tenderness. Take warm sitz baths to soothe any pain or discomfort caused by hemorrhoids. Use hemorrhoid cream if your health care provider approves. Rest with your legs raised (elevated) if you have leg cramps or low back pain. If you develop varicose veins: Wear support hose as told by your health care provider. Elevate your feet for 15 minutes, 3-4 times a day. Limit salt in your diet. Safety Wear your seat belt at all times when driving or riding in a car. Talk with your health care provider if someone is verbally or physically abusive to you. Lifestyle Do not use hot tubs, steam rooms, or saunas. Do not douche. Do not use tampons or scented sanitary pads. Avoid cat litter boxes and soil used by cats. These carry germs that can cause birth defects in the baby and possibly loss of the fetus by miscarriage or stillbirth. Do not use herbal remedies, alcohol, illegal drugs, or medicines that are not approved by your health care provider. Chemicals in these products can harm your baby. Do not use any products that contain nicotine or tobacco, such as cigarettes, e-cigarettes, and chewing tobacco. If you need help quitting, ask your health care provider. General instructions During a routine prenatal visit, your health care provider will do a physical exam and other tests. He or she will also discuss your overall health. Keep all follow-up visits. This is important. Ask your health care provider for a referral to a local prenatal education class. Ask for help if you have counseling or nutritional needs during pregnancy. Your health care provider can offer advice or refer you to specialists for help  with various needs. Where to find more information American Pregnancy Association: americanpregnancy.org [employer]: https://www.todd-brady.net/ Office on [employer] Health: MightyReward.co.nz Contact a health care provider if you have: A headache that does not go away when you take medicine. Vision changes or you see spots in front of your eyes. Mild pelvic cramps, pelvic pressure, or nagging pain in the abdominal area. Persistent nausea, vomiting, or diarrhea. A bad-smelling vaginal discharge or foul-smelling urine. Pain when you urinate. Sudden or extreme swelling of your face, hands, ankles, feet, or legs. A fever. Get help right away if you: Have fluid leaking from your vagina. Have spotting or bleeding from your vagina. Have severe abdominal cramping or pain. Have difficulty breathing. Have chest pain. Have fainting spells. Have not felt your baby move for the time period told by your health care provider. Have new or increased pain, swelling, or  redness in an arm or leg. Summary The second trimester of pregnancy is from week 13 through week 27 (months 4 through 6). Do not use herbal remedies, alcohol, illegal drugs, or medicines that are not approved by your health care provider. Chemicals in these products can harm your baby. Exercise only as directed by your health care provider. Most people can continue their usual exercise routine during pregnancy. Keep all follow-up visits. This is important. This information is not intended to replace advice given to you by your health care provider. Make sure you discuss any questions you have with your health care provider. Document Revised: 09/17/2019 Document Reviewed: 07/24/2019 Elsevier Patient Education  2024 [employer].  Third Trimester of Pregnancy  The third trimester of pregnancy is from week 28 through week 40. This is months 7 through 9. The third trimester is a  time when the unborn baby (fetus) is growing rapidly. At the end of the ninth month, the fetus is about 20 inches long and weighs 6-10 pounds. Body changes during your third trimester During the third trimester, your body will continue to go through many changes. The changes vary and generally return to normal after your baby is born. Physical changes Your weight will continue to increase. You can expect to gain 25-35 pounds (11-16 kg) by the end of the pregnancy if you begin pregnancy at a normal weight. If you are underweight, you can expect to gain 28-40 lb (about 13-18 kg), and if you are overweight, you can expect to gain 15-25 lb (about 7-11 kg). You may begin to get stretch marks on your hips, abdomen, and breasts. Your breasts will continue to grow and may hurt. A yellow fluid (colostrum) may leak from your breasts. This is the first milk you are producing for your baby. You may have changes in your hair. These can include thickening of your hair, rapid growth, and changes in texture. Some people also have hair loss during or after pregnancy, or hair that feels dry or thin. Your belly button may stick out. You may notice more swelling in your hands, face, or ankles. Health changes You may have heartburn. You may have constipation. You may develop hemorrhoids. You may develop swollen, bulging veins (varicose veins) in your legs. You may have increased body aches in the pelvis, back, or thighs. This is due to weight gain and increased hormones that are relaxing your joints. You may have increased tingling or numbness in your hands, arms, and legs. The skin on your abdomen may also feel numb. You may feel short of breath because of your expanding uterus. Other changes You may urinate more often because the fetus is moving lower into your pelvis and pressing on your bladder. You may have more problems sleeping. This may be caused by the size of your abdomen, an increased need to urinate, and  an increase in your body's metabolism. You may notice the fetus "dropping," or moving lower in your abdomen (lightening). You may have increased vaginal discharge. You may notice that you have pain around your pelvic bone as your uterus distends. Follow these instructions at home: Medicines Follow your health care provider's instructions regarding medicine use. Specific medicines may be either safe or unsafe to take during pregnancy. Do not take any medicines unless approved by your health care provider. Take a prenatal vitamin that contains at least 600 micrograms (mcg) of folic acid. Eating and drinking Eat a healthy diet that includes fresh fruits and vegetables, whole grains, good  sources of protein such as meat, eggs, or tofu, and low-fat dairy products. Avoid raw meat and unpasteurized juice, milk, and cheese. These carry germs that can harm you and your baby. Eat 4 or 5 small meals rather than 3 large meals a day. You may need to take these actions to prevent or treat constipation: Drink enough fluid to keep your urine pale yellow. Eat foods that are high in fiber, such as beans, whole grains, and fresh fruits and vegetables. Limit foods that are high in fat and processed sugars, such as fried or sweet foods. Activity Exercise only as directed by your health care provider. Most people can continue their usual exercise routine during pregnancy. Try to exercise for 30 minutes at least 5 days a week. Stop exercising if you experience contractions in the uterus. Stop exercising if you develop pain or cramping in the lower abdomen or lower back. Avoid heavy lifting. Do not exercise if it is very hot or humid or if you are at a high altitude. If you choose to, you may continue to have sex unless your health care provider tells you not to. Relieving pain and discomfort Take frequent breaks and rest with your legs raised (elevated) if you have leg cramps or low back pain. Take warm sitz baths  to soothe any pain or discomfort caused by hemorrhoids. Use hemorrhoid cream if your health care provider approves. Wear a supportive bra to prevent discomfort from breast tenderness. If you develop varicose veins: Wear support hose as told by your health care provider. Elevate your feet for 15 minutes, 3-4 times a day. Limit salt in your diet. Safety Talk to your health care provider before traveling far distances. Do not use hot tubs, steam rooms, or saunas. Wear your seat belt at all times when driving or riding in a car. Talk with your health care provider if someone is verbally or physically abusive to you. Preparing for birth To prepare for the arrival of your baby: Take prenatal classes to understand, practice, and ask questions about labor and delivery. Visit the hospital and tour the maternity area. Purchase a rear-facing car seat and make sure you know how to install it in your car. Prepare the baby's room or sleeping area. Make sure to remove all pillows and stuffed animals from the baby's crib to prevent suffocation. General instructions Avoid cat litter boxes and soil used by cats. These carry germs that can cause birth defects in the baby. If you have a cat, ask someone to clean the litter box for you. Do not douche or use tampons. Do not use scented sanitary pads. Do not use any products that contain nicotine or tobacco, such as cigarettes, e-cigarettes, and chewing tobacco. If you need help quitting, ask your health care provider. Do not use any herbal remedies, illegal drugs, or medicines that were not prescribed to you. Chemicals in these products can harm your baby. Do not drink alcohol. You will have more frequent prenatal exams during the third trimester. During a routine prenatal visit, your health care provider will do a physical exam, perform tests, and discuss your overall health. Keep all follow-up visits. This is important. Where to find more information American  Pregnancy Association: americanpregnancy.org [employer]ologists: https://www.todd-brady.net/ Office on [employer] Health: MightyReward.co.nz Contact a health care provider if you have: A fever. Mild pelvic cramps, pelvic pressure, or nagging pain in your abdominal area or lower back. Vomiting or diarrhea. Bad-smelling vaginal discharge or foul-smelling urine.  Pain when you urinate. A headache that does not go away when you take medicine. Visual changes or see spots in front of your eyes. Get help right away if: Your water breaks. You have regular contractions less than 5 minutes apart. You have spotting or bleeding from your vagina. You have severe abdominal pain. You have difficulty breathing. You have chest pain. You have fainting spells. You have not felt your baby move for the time period told by your health care provider. You have new or increased pain, swelling, or redness in an arm or leg. Summary The third trimester of pregnancy is from week 28 through week 40 (months 7 through 9). You may have more problems sleeping. This can be caused by the size of your abdomen, an increased need to urinate, and an increase in your body's metabolism. You will have more frequent prenatal exams during the third trimester. Keep all follow-up visits. This is important. This information is not intended to replace advice given to you by your health care provider. Make sure you discuss any questions you have with your health care provider. Document Revised: 09/17/2019 Document Reviewed: 07/24/2019 Elsevier Patient Education  2024 [employer].

## 2022-12-23 LAB — 28 WEEK RH+PANEL
Basophils Absolute: 0 10*3/uL (ref 0.0–0.2)
Basos: 0 %
EOS (ABSOLUTE): 0.1 10*3/uL (ref 0.0–0.4)
Eos: 1 %
Gestational Diabetes Screen: 103 mg/dL (ref 70–139)
HIV Screen 4th Generation wRfx: NONREACTIVE
Hematocrit: 34.8 % (ref 34.0–46.6)
Hemoglobin: 11.3 g/dL (ref 11.1–15.9)
Immature Grans (Abs): 0.1 10*3/uL (ref 0.0–0.1)
Immature Granulocytes: 1 %
Lymphocytes Absolute: 1.6 10*3/uL (ref 0.7–3.1)
Lymphs: 18 %
MCH: 27.7 pg (ref 26.6–33.0)
MCHC: 32.5 g/dL (ref 31.5–35.7)
MCV: 85 fL (ref 79–97)
Monocytes Absolute: 0.4 10*3/uL (ref 0.1–0.9)
Monocytes: 4 %
Neutrophils Absolute: 7.1 10*3/uL — ABNORMAL HIGH (ref 1.4–7.0)
Neutrophils: 76 %
Platelets: 201 10*3/uL (ref 150–450)
RBC: 4.08 x10E6/uL (ref 3.77–5.28)
RDW: 13.8 % (ref 11.7–15.4)
RPR Ser Ql: NONREACTIVE
WBC: 9.2 10*3/uL (ref 3.4–10.8)

## 2022-12-27 ENCOUNTER — Ambulatory Visit: Payer: 59

## 2022-12-27 ENCOUNTER — Ambulatory Visit: Payer: 59 | Attending: Obstetrics

## 2022-12-27 ENCOUNTER — Other Ambulatory Visit: Payer: Self-pay

## 2022-12-27 VITALS — BP 128/67 | HR 67 | Temp 98.2°F | Ht 65.0 in | Wt 267.0 lb

## 2022-12-27 DIAGNOSIS — O365930 Maternal care for other known or suspected poor fetal growth, third trimester, not applicable or unspecified: Secondary | ICD-10-CM | POA: Diagnosis present

## 2022-12-27 DIAGNOSIS — O43193 Other malformation of placenta, third trimester: Secondary | ICD-10-CM

## 2022-12-27 DIAGNOSIS — O99213 Obesity complicating pregnancy, third trimester: Secondary | ICD-10-CM

## 2022-12-27 DIAGNOSIS — Z3A27 27 weeks gestation of pregnancy: Secondary | ICD-10-CM | POA: Diagnosis not present

## 2022-12-27 DIAGNOSIS — O43192 Other malformation of placenta, second trimester: Secondary | ICD-10-CM

## 2022-12-27 DIAGNOSIS — O365920 Maternal care for other known or suspected poor fetal growth, second trimester, not applicable or unspecified: Secondary | ICD-10-CM | POA: Diagnosis not present

## 2022-12-27 DIAGNOSIS — Z2839 Other underimmunization status: Secondary | ICD-10-CM

## 2022-12-27 DIAGNOSIS — O99212 Obesity complicating pregnancy, second trimester: Secondary | ICD-10-CM | POA: Insufficient documentation

## 2022-12-27 DIAGNOSIS — E669 Obesity, unspecified: Secondary | ICD-10-CM

## 2022-12-27 DIAGNOSIS — O0993 Supervision of high risk pregnancy, unspecified, third trimester: Secondary | ICD-10-CM

## 2022-12-27 DIAGNOSIS — O43199 Other malformation of placenta, unspecified trimester: Secondary | ICD-10-CM

## 2023-01-03 ENCOUNTER — Ambulatory Visit (HOSPITAL_BASED_OUTPATIENT_CLINIC_OR_DEPARTMENT_OTHER): Payer: 59 | Admitting: *Deleted

## 2023-01-03 ENCOUNTER — Ambulatory Visit: Payer: 59

## 2023-01-03 ENCOUNTER — Ambulatory Visit: Payer: 59 | Attending: Obstetrics and Gynecology

## 2023-01-03 VITALS — BP 131/79 | HR 73

## 2023-01-03 DIAGNOSIS — O43193 Other malformation of placenta, third trimester: Secondary | ICD-10-CM

## 2023-01-03 DIAGNOSIS — Z2839 Other underimmunization status: Secondary | ICD-10-CM

## 2023-01-03 DIAGNOSIS — Z3A28 28 weeks gestation of pregnancy: Secondary | ICD-10-CM | POA: Diagnosis not present

## 2023-01-03 DIAGNOSIS — E669 Obesity, unspecified: Secondary | ICD-10-CM

## 2023-01-03 DIAGNOSIS — O365930 Maternal care for other known or suspected poor fetal growth, third trimester, not applicable or unspecified: Secondary | ICD-10-CM

## 2023-01-03 DIAGNOSIS — O99213 Obesity complicating pregnancy, third trimester: Secondary | ICD-10-CM

## 2023-01-03 DIAGNOSIS — O09899 Supervision of other high risk pregnancies, unspecified trimester: Secondary | ICD-10-CM | POA: Insufficient documentation

## 2023-01-03 DIAGNOSIS — O0993 Supervision of high risk pregnancy, unspecified, third trimester: Secondary | ICD-10-CM | POA: Diagnosis not present

## 2023-01-03 DIAGNOSIS — O43199 Other malformation of placenta, unspecified trimester: Secondary | ICD-10-CM | POA: Insufficient documentation

## 2023-01-03 NOTE — Procedures (Signed)
[name] 03-Jun-2000 [redacted]w[redacted]d  Fetus A Non-Stress Test Interpretation for 01/03/23-UAD with NST  Indication: IUGR and Morbidly Obese  Fetal Heart Rate A Mode: External Baseline Rate (A): 140 bpm Variability: Moderate Accelerations: 10 x 10 Decelerations: None Multiple birth?: No  Uterine Activity Mode: Toco Contraction Frequency (min): none Resting Tone Palpated: Relaxed  Interpretation (Fetal Testing) Nonstress Test Interpretation: Reactive Comments: Tracing reviewed by Dr.Booker

## 2023-01-05 ENCOUNTER — Encounter: Payer: Self-pay | Admitting: Licensed Practical Nurse

## 2023-01-05 ENCOUNTER — Ambulatory Visit (INDEPENDENT_AMBULATORY_CARE_PROVIDER_SITE_OTHER): Payer: 59 | Admitting: Licensed Practical Nurse

## 2023-01-05 VITALS — BP 113/77 | HR 79 | Wt 269.8 lb

## 2023-01-05 DIAGNOSIS — O0993 Supervision of high risk pregnancy, unspecified, third trimester: Secondary | ICD-10-CM

## 2023-01-05 DIAGNOSIS — Z3A28 28 weeks gestation of pregnancy: Secondary | ICD-10-CM

## 2023-01-05 NOTE — Progress Notes (Signed)
Routine Prenatal Care Visit  Subjective  Brooke Small is a 22 y.o. G1P0000 at [redacted]w[redacted]d being seen today for ongoing prenatal care.  She is currently monitored for the following issues for this high-risk pregnancy and has Supervision of high-risk pregnancy; Obesity affecting pregnancy; Anxiety; Rubella non-immune status, antepartum; Maternal varicella, non-immune; and Marginal insertion of umbilical cord affecting management of mother on their problem list.  ----------------------------------------------------------------------------------- Patient reports no complaints.  Here with her mother. Doing well. Mood is good. Has not signed up for CBE or BF classes yet. Reminded to look at hospital website.  -reviewed Anesthesia consult, recommend transfer to [facility] based practice as she more than likely will need to give birth in [place], pt agreeable to plan -Has follow up with MFM scheduled   Contractions: Irritability. Vag. Bleeding: None.  Movement: Present. Leaking Fluid denies.  ----------------------------------------------------------------------------------- The following portions of the patient's history were reviewed and updated as appropriate: allergies, current medications, past family history, past medical history, past social history, past surgical history and problem list. Problem list updated.  Objective  Blood pressure 113/77, pulse 79, weight 269 lb 12.8 oz (122.4 kg), last menstrual period 06/15/2022. Pregravid weight 272 lb (123.4 kg) Total Weight Gain -2 lb 3.2 oz (-0.998 kg) Urinalysis: Urine Protein    Urine Glucose    Fetal Status: Fetal Heart Rate (bpm): 150 Fundal Height: 27 cm Movement: Present     General:  Alert, oriented and cooperative. Patient is in no acute distress.  Skin: Skin is warm and dry. No rash noted.   Cardiovascular: Normal heart rate noted  Respiratory: Normal respiratory effort, no problems with respiration noted  Abdomen: Soft, gravid,  appropriate for gestational age. Pain/Pressure: Absent     Pelvic:  Cervical exam deferred        Extremities: Normal range of motion.  Edema: Trace  Mental Status: Normal mood and affect. Normal behavior. Normal judgment and thought content.   Assessment   22 y.o. G1P0000 at [redacted]w[redacted]d by  03/25/2023, by Ultrasound presenting for routine prenatal visit  Plan   first Problems (from 08/17/22 to present)     Problem Noted Resolved   Marginal insertion of umbilical cord affecting management of mother 11/01/2022 by Burney Gauze, CNM No   Overview Signed 11/01/2022  3:36 PM by Burney Gauze, CNM    Noted on anatomy ultrasound      Rubella non-immune status, antepartum 10/30/2022 by Free, Lindalou Hose, CNM No   Maternal varicella, non-immune 10/30/2022 by Burney Gauze, CNM No   Supervision of high-risk pregnancy 08/17/2022 by Loran Senters, CMA No   Overview Addendum 12/27/2022  3:00 PM by Mirna Mires, CNM     Clinical Staff Provider  Office Location  [facility] Dating  By LMP c/w 10w u/s  Language  English Anatomy US  Normal anatomy female  Flu Vaccine  UTD Genetic Screen  NIPS:   TDaP vaccine   ACHD Hgb A1C or  GTT Early : Hgb A1C 5.7 Third trimester : 103  Covid One dose   LAB RESULTS   Rhogam  A/Positive/-- (05/09 1630)  Blood Type A/Positive/-- (05/09 1630)   Feeding Plan breast Antibody Negative (05/09 1630)  Contraception undecided Rubella <0.90 (05/09 1630)  Circumcision yes RPR Non Reactive (05/09 1630)   Pediatrician  undecided HBsAg Negative (05/09 1630)   Support Person Raiden HIV Non Reactive (05/09 1630)  Prenatal Classes  Varicella     GBS  (For PCN allergy, check sensitivities)  BTL Consent  Hep C Non Reactive (05/09 1630)   VBAC Consent NA Pap No results found for: "DIAGPAP"    Hgb Electro      CF      SMA         Obesity BMI 45 at NOB Early Hgb A1C 5.7 prediabetes- referral to Lifestyles sent Low dose ASA start at 12 weeks Anesthesia consult Growth  scans NSTs           Preterm labor symptoms and general obstetric precautions including but not limited to vaginal bleeding, contractions, leaking of fluid and fetal movement were reviewed in detail with the patient. Please refer to After Visit Summary for other counseling recommendations.   Return in about 2 weeks (around 01/19/2023) for ROB, needs to transfer to [place] .  Referral to [facility] placed   Carie Caddy, CNM  [facility]  01/05/23  11:45 AM

## 2023-01-08 ENCOUNTER — Telehealth: Payer: Self-pay

## 2023-01-08 NOTE — Telephone Encounter (Signed)
Left message for pt to call office back to confirm appt

## 2023-01-09 ENCOUNTER — Ambulatory Visit: Payer: 59 | Admitting: *Deleted

## 2023-01-09 ENCOUNTER — Ambulatory Visit: Payer: 59 | Attending: Obstetrics and Gynecology

## 2023-01-09 ENCOUNTER — Other Ambulatory Visit: Payer: Self-pay | Admitting: *Deleted

## 2023-01-09 ENCOUNTER — Ambulatory Visit (HOSPITAL_BASED_OUTPATIENT_CLINIC_OR_DEPARTMENT_OTHER): Payer: 59 | Admitting: *Deleted

## 2023-01-09 VITALS — BP 132/67 | HR 87

## 2023-01-09 DIAGNOSIS — O43199 Other malformation of placenta, unspecified trimester: Secondary | ICD-10-CM

## 2023-01-09 DIAGNOSIS — O365930 Maternal care for other known or suspected poor fetal growth, third trimester, not applicable or unspecified: Secondary | ICD-10-CM

## 2023-01-09 DIAGNOSIS — Z3A29 29 weeks gestation of pregnancy: Secondary | ICD-10-CM | POA: Diagnosis not present

## 2023-01-09 DIAGNOSIS — O43193 Other malformation of placenta, third trimester: Secondary | ICD-10-CM | POA: Diagnosis not present

## 2023-01-09 DIAGNOSIS — Z3490 Encounter for supervision of normal pregnancy, unspecified, unspecified trimester: Secondary | ICD-10-CM

## 2023-01-09 DIAGNOSIS — Z2839 Other underimmunization status: Secondary | ICD-10-CM

## 2023-01-09 DIAGNOSIS — O99213 Obesity complicating pregnancy, third trimester: Secondary | ICD-10-CM

## 2023-01-09 DIAGNOSIS — O0993 Supervision of high risk pregnancy, unspecified, third trimester: Secondary | ICD-10-CM | POA: Diagnosis not present

## 2023-01-09 DIAGNOSIS — O09899 Supervision of other high risk pregnancies, unspecified trimester: Secondary | ICD-10-CM | POA: Insufficient documentation

## 2023-01-09 DIAGNOSIS — E669 Obesity, unspecified: Secondary | ICD-10-CM

## 2023-01-09 NOTE — Procedures (Signed)
Brooke Small [birth year] [redacted]w[redacted]d  Fetus A Non-Stress Test Interpretation for 01/09/23  Indication: IUGR  Fetal Heart Rate A Mode: External Baseline Rate (A): 150 bpm Variability: Moderate Accelerations: 10 x 10 Decelerations: None Multiple birth?: No  Uterine Activity Mode: Palpation, Toco Contraction Frequency (min): None Resting Tone Palpated: Relaxed Resting Time: Adequate  Interpretation (Fetal Testing) Nonstress Test Interpretation: Reactive Comments: Dr. Parke Poisson reviewed tracing.

## 2023-01-16 ENCOUNTER — Other Ambulatory Visit: Payer: Self-pay | Admitting: *Deleted

## 2023-01-16 ENCOUNTER — Encounter: Payer: Self-pay | Admitting: *Deleted

## 2023-01-16 ENCOUNTER — Ambulatory Visit: Payer: 59 | Attending: Obstetrics

## 2023-01-16 ENCOUNTER — Ambulatory Visit: Payer: 59 | Admitting: *Deleted

## 2023-01-16 VITALS — BP 138/60 | HR 66

## 2023-01-16 DIAGNOSIS — O365930 Maternal care for other known or suspected poor fetal growth, third trimester, not applicable or unspecified: Secondary | ICD-10-CM | POA: Diagnosis not present

## 2023-01-16 DIAGNOSIS — Z3A30 30 weeks gestation of pregnancy: Secondary | ICD-10-CM | POA: Diagnosis not present

## 2023-01-16 DIAGNOSIS — O43199 Other malformation of placenta, unspecified trimester: Secondary | ICD-10-CM

## 2023-01-16 DIAGNOSIS — O365990 Maternal care for other known or suspected poor fetal growth, unspecified trimester, not applicable or unspecified: Secondary | ICD-10-CM | POA: Diagnosis present

## 2023-01-16 DIAGNOSIS — Z2839 Other underimmunization status: Secondary | ICD-10-CM | POA: Insufficient documentation

## 2023-01-16 DIAGNOSIS — Z3A34 34 weeks gestation of pregnancy: Secondary | ICD-10-CM | POA: Diagnosis not present

## 2023-01-16 DIAGNOSIS — O09899 Supervision of other high risk pregnancies, unspecified trimester: Secondary | ICD-10-CM | POA: Diagnosis present

## 2023-01-16 DIAGNOSIS — O43193 Other malformation of placenta, third trimester: Secondary | ICD-10-CM

## 2023-01-16 DIAGNOSIS — O99212 Obesity complicating pregnancy, second trimester: Secondary | ICD-10-CM | POA: Diagnosis present

## 2023-01-16 DIAGNOSIS — O365920 Maternal care for other known or suspected poor fetal growth, second trimester, not applicable or unspecified: Secondary | ICD-10-CM | POA: Diagnosis present

## 2023-01-16 DIAGNOSIS — O43192 Other malformation of placenta, second trimester: Secondary | ICD-10-CM | POA: Diagnosis present

## 2023-01-16 DIAGNOSIS — E669 Obesity, unspecified: Secondary | ICD-10-CM | POA: Diagnosis not present

## 2023-01-16 DIAGNOSIS — O99213 Obesity complicating pregnancy, third trimester: Secondary | ICD-10-CM | POA: Insufficient documentation

## 2023-01-16 NOTE — Procedures (Signed)
Brooke Small 2001-02-11 [redacted]w[redacted]d  Fetus A Non-Stress Test Interpretation for 09/24/24NST with dooplers  Indication: IUGR and MCI, obese  Fetal Heart Rate A Mode: External Baseline Rate (A): 145 bpm Variability: Moderate Accelerations: 10 x 10 Decelerations: None Multiple birth?: No  Uterine Activity Mode: Toco Contraction Frequency (min): none Resting Tone Palpated: Relaxed  Interpretation (Fetal Testing) Nonstress Test Interpretation: Reactive Comments: Tracing reviewed byDr. Darra Lis

## 2023-01-18 ENCOUNTER — Encounter: Payer: Self-pay | Admitting: *Deleted

## 2023-01-19 ENCOUNTER — Encounter: Payer: 59 | Admitting: Obstetrics

## 2023-01-23 ENCOUNTER — Ambulatory Visit: Payer: 59 | Admitting: Obstetrics and Gynecology

## 2023-01-23 ENCOUNTER — Encounter: Payer: Self-pay | Admitting: Obstetrics and Gynecology

## 2023-01-23 VITALS — BP 139/89 | HR 80 | Wt 281.0 lb

## 2023-01-23 DIAGNOSIS — Z23 Encounter for immunization: Secondary | ICD-10-CM

## 2023-01-23 DIAGNOSIS — O0993 Supervision of high risk pregnancy, unspecified, third trimester: Secondary | ICD-10-CM

## 2023-01-23 DIAGNOSIS — O99213 Obesity complicating pregnancy, third trimester: Secondary | ICD-10-CM | POA: Diagnosis not present

## 2023-01-23 DIAGNOSIS — O133 Gestational [pregnancy-induced] hypertension without significant proteinuria, third trimester: Secondary | ICD-10-CM | POA: Diagnosis not present

## 2023-01-23 DIAGNOSIS — O365990 Maternal care for other known or suspected poor fetal growth, unspecified trimester, not applicable or unspecified: Secondary | ICD-10-CM | POA: Diagnosis not present

## 2023-01-23 DIAGNOSIS — Z3A31 31 weeks gestation of pregnancy: Secondary | ICD-10-CM | POA: Diagnosis not present

## 2023-01-23 DIAGNOSIS — Z1332 Encounter for screening for maternal depression: Secondary | ICD-10-CM | POA: Diagnosis not present

## 2023-01-23 DIAGNOSIS — Z6841 Body Mass Index (BMI) 40.0 and over, adult: Secondary | ICD-10-CM

## 2023-01-23 LAB — POCT URINALYSIS DIPSTICK: Protein, UA: POSITIVE — AB

## 2023-01-23 NOTE — Progress Notes (Signed)
Transfer OB.  B/P elevated will repeat pt denies any visual changes, no HA's, no upper abdominal pain.   CC: None

## 2023-01-23 NOTE — Patient Instructions (Signed)
Continue to check your blood pressures once a day Call the office for blood pressures that are consistently above 140 for the top number or 90 for the bottom number   Hypertension During Pregnancy Hypertension is also called high blood pressure. High blood pressure means that the force of your blood moving in your body is too strong. It can cause problems for you and your baby. Different types of high blood pressure can happen during pregnancy. The types are: High blood pressure before you got pregnant. This is called chronic hypertension.  This can continue during your pregnancy. Your doctor will want to keep checking your blood pressure. You may need medicine to keep your blood pressure under control while you are pregnant. You will need follow-up visits after you have your baby. High blood pressure that goes up during pregnancy when it was normal before. This is called gestational hypertension. It will usually get better after you have your baby, but your doctor will need to watch your blood pressure to make sure that it is getting better. Very high blood pressure during pregnancy. This is called preeclampsia. Very high blood pressure is an emergency that needs to be checked and treated right away. You may develop very high blood pressure after giving birth. This is called postpartum preeclampsia. This usually occurs within 48 hours after childbirth but may occur up to 6 weeks after giving birth. This is rare. How does this affect me? If you have high blood pressure during pregnancy, you have a higher chance of developing high blood pressure: As you get older. If you get pregnant again. In some cases, high blood pressure during pregnancy can cause: Stroke. Heart attack. Damage to the kidneys, lungs, or liver. Preeclampsia. Jerky movements you cannot control (convulsions or seizures). Problems with the placenta.  What can I do to lower my risk?  Keep a healthy weight. Eat a healthy  diet. Follow what your doctor tells you about treating any medical problems that you had before becoming pregnant. It is very important to go to all of your doctor visits. Your doctor will check your blood pressure and make sure that your pregnancy is progressing as it should. Treatment should start early if a problem is found.  Follow these instructions at home:  Take your blood pressure 1-2 times per day. Call the office if your blood pressure is 155 or higher for the top number or 105 or higher for the bottom number.    Eating and drinking  Drink enough fluid to keep your pee (urine) pale yellow. Avoid caffeine. Lifestyle Do not use any products that contain nicotine or tobacco, such as cigarettes, e-cigarettes, and chewing tobacco. If you need help quitting, ask your doctor. Do not use alcohol or drugs. Avoid stress. Rest and get plenty of sleep. Regular exercise can help. Ask your doctor what kinds of exercise are best for you. General instructions Take over-the-counter and prescription medicines only as told by your doctor. Keep all prenatal and follow-up visits as told by your doctor. This is important. Contact a doctor if: You have symptoms that your doctor told you to watch for, such as: Headaches. Nausea. Vomiting. Belly (abdominal) pain. Dizziness. Light-headedness. Get help right away if: You have: Very bad belly pain that does not get better with treatment. A very bad headache that does not get better. Vomiting that does not get better. Sudden, fast weight gain. Sudden swelling in your hands, ankles, or face. Blood in your pee. Blurry vision. Double vision.  Shortness of breath. Chest pain. Weakness on one side of your body. Trouble talking. Summary High blood pressure is also called hypertension. High blood pressure means that the force of your blood moving in your body is too strong. High blood pressure can cause problems for you and your baby. Keep all  follow-up visits as told by your doctor. This is important. This information is not intended to replace advice given to you by your health care provider. Make sure you discuss any questions you have with your health care provider. Document Released: 05/13/2010 Document Revised: 08/01/2018 Document Reviewed: 05/07/2018 Elsevier Patient Education  2020 [employer].

## 2023-01-23 NOTE — Progress Notes (Signed)
PRENATAL VISIT NOTE  Transfer of care from [facility] for obesity  Subjective:  Brooke Small is a 22 y.o. G1P0000 at [redacted]w[redacted]d being seen today for ongoing prenatal care.  She is currently monitored for the following issues for this high-risk pregnancy and has Supervision of high-risk pregnancy; Obesity affecting pregnancy; Rubella non-immune status, antepartum; Maternal varicella, non-immune; Marginal insertion of umbilical cord affecting management of mother; Fetal growth restriction antepartum; and BMI 45.0-49.9, adult (HCC) on their problem list.  Patient reports no complaints.  Contractions: Irritability. Vag. Bleeding: None.  Movement: Present. Denies leaking of fluid.   The following portions of the patient's history were reviewed and updated as appropriate: allergies, current medications, past family history, past medical history, past social history, past surgical history and problem list.   Objective:   Vitals:   01/23/23 1337 01/23/23 1359  BP: (!) 150/95 139/89  Pulse: 76 80  Weight: 281 lb (127.5 kg)     Fetal Status: Fetal Heart Rate (bpm): 144   Movement: Present     General:  Alert, oriented and cooperative. Patient is in no acute distress.  Skin: Skin is warm and dry. No rash noted.   Cardiovascular: Normal heart rate noted  Respiratory: Normal respiratory effort, no problems with respiration noted  Abdomen: Soft, gravid, appropriate for gestational age.  Pain/Pressure: Present     Pelvic: Cervical exam deferred        Extremities: Normal range of motion.  Edema: Trace  Mental Status: Normal mood and affect. Normal behavior. Normal judgment and thought content.   Assessment and Plan:  Pregnancy: G1P0000 at [redacted]w[redacted]d 1. Transient hypertension of pregnancy in third trimester Pre-eclampsia precautions given and recommend daily BP checks at home or work. Udip today showed 1+ protein. Will get formal PC ratio and bloodwork  2. [redacted] weeks gestation of pregnancy  3.  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  4. Obesity affecting pregnancy in third trimester, unspecified obesity type  5. BMI 45.0-49.9, adult (HCC)  6. Flu vaccine need - Flu vaccine trivalent PF, 6mos and older(Flulaval,Afluria,Fluarix,Fluzone)  7. Need for diphtheria-tetanus-pertussis (Tdap) vaccine - Tdap vaccine greater than or equal to 7yo IM  8. Supervision of high risk pregnancy in third trimester 28wk labs and other OB labs wnl.   Preterm labor symptoms and general obstetric precautions including but not limited to vaginal bleeding, contractions, leaking of fluid and fetal movement were reviewed in detail with the patient. Please refer to After Visit Summary for other counseling recommendations.   Return in about 2 weeks (around 02/06/2023) for in person, md visit, high risk ob.  Future Appointments  Date Time Provider Department Center  01/24/2023  1:15 PM [facility] NST [facility]  01/24/2023  2:30 PM [facility] US3 [facility]  01/31/2023  1:30 PM [facility] US4 [facility]  02/06/2023  1:00 PM [facility] NURSE [facility]  02/06/2023  1:15 PM [facility] NST [facility]  02/06/2023  2:30 PM [facility] US5 [facility]  02/13/2023  1:00 PM [facility] NURSE [facility]  02/13/2023  1:15 PM [facility] NST [facility]  02/13/2023  2:30 PM [facility] US3 [facility]  [facility]    [place] Bing, MD

## 2023-01-24 ENCOUNTER — Ambulatory Visit: Payer: 59 | Admitting: *Deleted

## 2023-01-24 ENCOUNTER — Ambulatory Visit: Payer: 59

## 2023-01-24 VITALS — BP 131/74

## 2023-01-24 DIAGNOSIS — O99213 Obesity complicating pregnancy, third trimester: Secondary | ICD-10-CM

## 2023-01-24 DIAGNOSIS — O1414 Severe pre-eclampsia complicating childbirth: Secondary | ICD-10-CM | POA: Diagnosis not present

## 2023-01-24 DIAGNOSIS — E669 Obesity, unspecified: Secondary | ICD-10-CM | POA: Diagnosis not present

## 2023-01-24 DIAGNOSIS — O365930 Maternal care for other known or suspected poor fetal growth, third trimester, not applicable or unspecified: Secondary | ICD-10-CM

## 2023-01-24 DIAGNOSIS — O365990 Maternal care for other known or suspected poor fetal growth, unspecified trimester, not applicable or unspecified: Secondary | ICD-10-CM

## 2023-01-24 DIAGNOSIS — O43193 Other malformation of placenta, third trimester: Secondary | ICD-10-CM | POA: Diagnosis not present

## 2023-01-24 DIAGNOSIS — Z3A31 31 weeks gestation of pregnancy: Secondary | ICD-10-CM

## 2023-01-24 LAB — COMPREHENSIVE METABOLIC PANEL
ALT: 16 [IU]/L (ref 0–32)
AST: 23 [IU]/L (ref 0–40)
Albumin: 3.3 g/dL — ABNORMAL LOW (ref 4.0–5.0)
Alkaline Phosphatase: 113 [IU]/L — ABNORMAL HIGH (ref 42–106)
BUN/Creatinine Ratio: 15 (ref 9–23)
BUN: 8 mg/dL (ref 6–20)
Bilirubin Total: 0.3 mg/dL (ref 0.0–1.2)
CO2: 18 mmol/L — ABNORMAL LOW (ref 20–29)
Calcium: 8.7 mg/dL (ref 8.7–10.2)
Chloride: 106 mmol/L (ref 96–106)
Creatinine, Ser: 0.55 mg/dL — ABNORMAL LOW (ref 0.57–1.00)
Globulin, Total: 2.3 g/dL (ref 1.5–4.5)
Glucose: 112 mg/dL — ABNORMAL HIGH (ref 70–99)
Potassium: 4.1 mmol/L (ref 3.5–5.2)
Sodium: 138 mmol/L (ref 134–144)
Total Protein: 5.6 g/dL — ABNORMAL LOW (ref 6.0–8.5)
eGFR: 134 mL/min/{1.73_m2} (ref >59)

## 2023-01-24 LAB — PROTEIN / CREATININE RATIO, URINE
Creatinine, Urine: 197.6 mg/dL
Protein, Ur: 57.6 mg/dL
Protein/Creat Ratio: 291 mg/g{creat} — ABNORMAL HIGH (ref 0–200)

## 2023-01-24 LAB — CBC
Hematocrit: 37.1 % (ref 34.0–46.6)
Hemoglobin: 12 g/dL (ref 11.1–15.9)
MCH: 27.3 pg (ref 26.6–33.0)
MCHC: 32.3 g/dL (ref 31.5–35.7)
MCV: 85 fL (ref 79–97)
Platelets: 163 10*3/uL (ref 150–450)
RBC: 4.39 x10E6/uL (ref 3.77–5.28)
RDW: 13.1 % (ref 11.7–15.4)
WBC: 9 10*3/uL (ref 3.4–10.8)

## 2023-01-24 NOTE — Procedures (Signed)
Brooke Small 01/24/2001 [redacted]w[redacted]d  Fetus A Non-Stress Test Interpretation for 01/24/23  NST only  Indication: IUGR  Fetal Heart Rate A Mode: External Baseline Rate (A): 145 bpm Variability: Minimal, Moderate Accelerations: 10 x 10 Decelerations: None Multiple birth?: No  Uterine Activity Mode: Palpation, Toco Contraction Frequency (min): none Resting Tone Palpated: Relaxed  Interpretation (Fetal Testing) Nonstress Test Interpretation: Reactive Overall Impression: Reassuring for gestational age Comments: Dr. Grace Bushy reviewed tracing

## 2023-01-25 ENCOUNTER — Observation Stay: Payer: 59

## 2023-01-25 ENCOUNTER — Encounter: Payer: Self-pay | Admitting: Obstetrics and Gynecology

## 2023-01-25 ENCOUNTER — Other Ambulatory Visit: Payer: Self-pay

## 2023-01-25 ENCOUNTER — Ambulatory Visit: Payer: 59

## 2023-01-25 ENCOUNTER — Inpatient Hospital Stay (HOSPITAL_COMMUNITY)
Admission: AD | Admit: 2023-01-25 | Discharge: 2023-01-30 | DRG: 806 | Disposition: A | Discharge status: Home / Self Care | Payer: 59 | Attending: Obstetrics and Gynecology | Admitting: Obstetrics and Gynecology

## 2023-01-25 ENCOUNTER — Encounter (HOSPITAL_COMMUNITY): Payer: Self-pay | Admitting: Family Medicine

## 2023-01-25 ENCOUNTER — Observation Stay
Admission: EM | Admit: 2023-01-25 | Discharge: 2023-01-25 | Disposition: A | Discharge status: Home / Self Care | Payer: 59 | Attending: Obstetrics | Admitting: Obstetrics

## 2023-01-25 DIAGNOSIS — K8020 Calculus of gallbladder without cholecystitis without obstruction: Secondary | ICD-10-CM | POA: Diagnosis present

## 2023-01-25 DIAGNOSIS — O1410 Severe pre-eclampsia, unspecified trimester: Principal | ICD-10-CM | POA: Diagnosis present

## 2023-01-25 DIAGNOSIS — Z3A31 31 weeks gestation of pregnancy: Secondary | ICD-10-CM

## 2023-01-25 DIAGNOSIS — E669 Obesity, unspecified: Secondary | ICD-10-CM | POA: Diagnosis not present

## 2023-01-25 DIAGNOSIS — Z8249 Family history of ischemic heart disease and other diseases of the circulatory system: Secondary | ICD-10-CM | POA: Diagnosis not present

## 2023-01-25 DIAGNOSIS — O99284 Endocrine, nutritional and metabolic diseases complicating childbirth: Secondary | ICD-10-CM | POA: Diagnosis present

## 2023-01-25 DIAGNOSIS — O1493 Unspecified pre-eclampsia, third trimester: Secondary | ICD-10-CM | POA: Insufficient documentation

## 2023-01-25 DIAGNOSIS — O26643 Intrahepatic cholestasis of pregnancy, third trimester: Secondary | ICD-10-CM | POA: Insufficient documentation

## 2023-01-25 DIAGNOSIS — O99513 Diseases of the respiratory system complicating pregnancy, third trimester: Secondary | ICD-10-CM | POA: Diagnosis not present

## 2023-01-25 DIAGNOSIS — O0993 Supervision of high risk pregnancy, unspecified, third trimester: Principal | ICD-10-CM

## 2023-01-25 DIAGNOSIS — J45909 Unspecified asthma, uncomplicated: Secondary | ICD-10-CM | POA: Diagnosis not present

## 2023-01-25 DIAGNOSIS — Z79899 Other long term (current) drug therapy: Secondary | ICD-10-CM | POA: Insufficient documentation

## 2023-01-25 DIAGNOSIS — O365930 Maternal care for other known or suspected poor fetal growth, third trimester, not applicable or unspecified: Secondary | ICD-10-CM | POA: Diagnosis not present

## 2023-01-25 DIAGNOSIS — Z825 Family history of asthma and other chronic lower respiratory diseases: Secondary | ICD-10-CM

## 2023-01-25 DIAGNOSIS — Z3A32 32 weeks gestation of pregnancy: Secondary | ICD-10-CM | POA: Diagnosis not present

## 2023-01-25 DIAGNOSIS — O1413 Severe pre-eclampsia, third trimester: Secondary | ICD-10-CM | POA: Diagnosis not present

## 2023-01-25 DIAGNOSIS — O1414 Severe pre-eclampsia complicating childbirth: Principal | ICD-10-CM | POA: Diagnosis present

## 2023-01-25 DIAGNOSIS — O99113 Other diseases of the blood and blood-forming organs and certain disorders involving the immune mechanism complicating pregnancy, third trimester: Secondary | ICD-10-CM | POA: Diagnosis present

## 2023-01-25 DIAGNOSIS — O2662 Liver and biliary tract disorders in childbirth: Secondary | ICD-10-CM | POA: Diagnosis present

## 2023-01-25 DIAGNOSIS — O9962 Diseases of the digestive system complicating childbirth: Secondary | ICD-10-CM | POA: Diagnosis present

## 2023-01-25 DIAGNOSIS — O43123 Velamentous insertion of umbilical cord, third trimester: Secondary | ICD-10-CM | POA: Diagnosis present

## 2023-01-25 DIAGNOSIS — R1011 Right upper quadrant pain: Secondary | ICD-10-CM | POA: Diagnosis not present

## 2023-01-25 DIAGNOSIS — O4423 Partial placenta previa NOS or without hemorrhage, third trimester: Secondary | ICD-10-CM | POA: Diagnosis not present

## 2023-01-25 DIAGNOSIS — Z7982 Long term (current) use of aspirin: Secondary | ICD-10-CM | POA: Insufficient documentation

## 2023-01-25 DIAGNOSIS — O26893 Other specified pregnancy related conditions, third trimester: Secondary | ICD-10-CM | POA: Diagnosis not present

## 2023-01-25 DIAGNOSIS — O99213 Obesity complicating pregnancy, third trimester: Secondary | ICD-10-CM | POA: Diagnosis not present

## 2023-01-25 DIAGNOSIS — O43193 Other malformation of placenta, third trimester: Secondary | ICD-10-CM | POA: Diagnosis not present

## 2023-01-25 DIAGNOSIS — O09899 Supervision of other high risk pregnancies, unspecified trimester: Secondary | ICD-10-CM

## 2023-01-25 DIAGNOSIS — O9912 Other diseases of the blood and blood-forming organs and certain disorders involving the immune mechanism complicating childbirth: Secondary | ICD-10-CM | POA: Diagnosis not present

## 2023-01-25 DIAGNOSIS — O1490 Unspecified pre-eclampsia, unspecified trimester: Secondary | ICD-10-CM | POA: Insufficient documentation

## 2023-01-25 DIAGNOSIS — D6959 Other secondary thrombocytopenia: Secondary | ICD-10-CM | POA: Diagnosis present

## 2023-01-25 DIAGNOSIS — O99214 Obesity complicating childbirth: Secondary | ICD-10-CM | POA: Diagnosis not present

## 2023-01-25 DIAGNOSIS — Z833 Family history of diabetes mellitus: Secondary | ICD-10-CM | POA: Diagnosis not present

## 2023-01-25 DIAGNOSIS — O43199 Other malformation of placenta, unspecified trimester: Secondary | ICD-10-CM

## 2023-01-25 DIAGNOSIS — J90 Pleural effusion, not elsewhere classified: Secondary | ICD-10-CM | POA: Diagnosis not present

## 2023-01-25 DIAGNOSIS — D696 Thrombocytopenia, unspecified: Secondary | ICD-10-CM | POA: Diagnosis not present

## 2023-01-25 DIAGNOSIS — O418X90 Other specified disorders of amniotic fluid and membranes, unspecified trimester, not applicable or unspecified: Secondary | ICD-10-CM | POA: Diagnosis not present

## 2023-01-25 DIAGNOSIS — O99344 Other mental disorders complicating childbirth: Secondary | ICD-10-CM | POA: Diagnosis not present

## 2023-01-25 DIAGNOSIS — Z3A34 34 weeks gestation of pregnancy: Secondary | ICD-10-CM

## 2023-01-25 LAB — PROTEIN / CREATININE RATIO, URINE
Creatinine, Urine: 45 mg/dL
Creatinine, Urine: 186 mg/dL
Protein Creatinine Ratio: 0.89 mg/mg{creat} — ABNORMAL HIGH (ref 0.00–0.15)
Protein Creatinine Ratio: 0.58 mg/mg{creat} — ABNORMAL HIGH (ref 0.00–0.15)
Total Protein, Urine: 40 mg/dL
Total Protein, Urine: 108 mg/dL

## 2023-01-25 LAB — URINALYSIS, COMPLETE (UACMP) WITH MICROSCOPIC
Bilirubin Urine: NEGATIVE
Glucose, UA: NEGATIVE mg/dL
Hgb urine dipstick: NEGATIVE
Ketones, ur: NEGATIVE mg/dL
Leukocytes,Ua: NEGATIVE
Nitrite: NEGATIVE
Protein, ur: 30 mg/dL — AB
Specific Gravity, Urine: 1.004 — ABNORMAL LOW (ref 1.005–1.030)
pH: 6 (ref 5.0–8.0)

## 2023-01-25 LAB — COMPREHENSIVE METABOLIC PANEL
ALT: 32 U/L (ref 0–44)
ALT: 30 U/L (ref 0–44)
AST: 42 U/L — ABNORMAL HIGH (ref 15–41)
AST: 28 U/L (ref 15–41)
Albumin: 2.7 g/dL — ABNORMAL LOW (ref 3.5–5.0)
Albumin: 2.5 g/dL — ABNORMAL LOW (ref 3.5–5.0)
Alkaline Phosphatase: 90 U/L (ref 38–126)
Alkaline Phosphatase: 91 U/L (ref 38–126)
Anion gap: 8 (ref 5–15)
Anion gap: 9 (ref 5–15)
BUN: 8 mg/dL (ref 6–20)
BUN: 9 mg/dL (ref 6–20)
CO2: 18 mmol/L — ABNORMAL LOW (ref 22–32)
CO2: 18 mmol/L — ABNORMAL LOW (ref 22–32)
Calcium: 8.4 mg/dL — ABNORMAL LOW (ref 8.9–10.3)
Calcium: 8.5 mg/dL — ABNORMAL LOW (ref 8.9–10.3)
Chloride: 108 mmol/L (ref 98–111)
Chloride: 109 mmol/L (ref 98–111)
Creatinine, Ser: 0.47 mg/dL (ref 0.44–1.00)
Creatinine, Ser: 0.67 mg/dL (ref 0.44–1.00)
GFR, Estimated: >60 mL/min (ref >60)
GFR, Estimated: >60 mL/min (ref >60)
Glucose, Bld: 87 mg/dL (ref 70–99)
Glucose, Bld: 132 mg/dL — ABNORMAL HIGH (ref 70–99)
Potassium: 3.7 mmol/L (ref 3.5–5.1)
Potassium: 3.8 mmol/L (ref 3.5–5.1)
Sodium: 134 mmol/L — ABNORMAL LOW (ref 135–145)
Sodium: 136 mmol/L (ref 135–145)
Total Bilirubin: 0.8 mg/dL (ref 0.3–1.2)
Total Bilirubin: 0.5 mg/dL (ref 0.3–1.2)
Total Protein: 6.2 g/dL — ABNORMAL LOW (ref 6.5–8.1)
Total Protein: 5.7 g/dL — ABNORMAL LOW (ref 6.5–8.1)

## 2023-01-25 LAB — CBC WITH DIFFERENTIAL/PLATELET
Abs Immature Granulocytes: 0.09 10*3/uL — ABNORMAL HIGH (ref 0.00–0.07)
Basophils Absolute: 0 10*3/uL (ref 0.0–0.1)
Basophils Relative: 0 %
Eosinophils Absolute: 0 10*3/uL (ref 0.0–0.5)
Eosinophils Relative: 0 %
HCT: 32.4 % — ABNORMAL LOW (ref 36.0–46.0)
Hemoglobin: 10.6 g/dL — ABNORMAL LOW (ref 12.0–15.0)
Immature Granulocytes: 1 %
Lymphocytes Relative: 11 %
Lymphs Abs: 0.9 10*3/uL (ref 0.7–4.0)
MCH: 26.4 pg (ref 26.0–34.0)
MCHC: 32.7 g/dL (ref 30.0–36.0)
MCV: 80.8 fL (ref 80.0–100.0)
Monocytes Absolute: 0.3 10*3/uL (ref 0.1–1.0)
Monocytes Relative: 3 %
Neutro Abs: 7.6 10*3/uL (ref 1.7–7.7)
Neutrophils Relative %: 85 %
Platelets: 132 10*3/uL — ABNORMAL LOW (ref 150–400)
RBC: 4.01 MIL/uL (ref 3.87–5.11)
RDW: 13 % (ref 11.5–15.5)
WBC: 8.9 10*3/uL (ref 4.0–10.5)
nRBC: 0 % (ref 0.0–0.2)

## 2023-01-25 LAB — CBC
HCT: 34.1 % — ABNORMAL LOW (ref 36.0–46.0)
Hemoglobin: 11.4 g/dL — ABNORMAL LOW (ref 12.0–15.0)
MCH: 27.3 pg (ref 26.0–34.0)
MCHC: 33.4 g/dL (ref 30.0–36.0)
MCV: 81.8 fL (ref 80.0–100.0)
Platelets: 137 10*3/uL — ABNORMAL LOW (ref 150–400)
RBC: 4.17 MIL/uL (ref 3.87–5.11)
RDW: 12.9 % (ref 11.5–15.5)
WBC: 10.5 10*3/uL (ref 4.0–10.5)
nRBC: 0 % (ref 0.0–0.2)

## 2023-01-25 LAB — LACTATE DEHYDROGENASE: LDH: 206 U/L — ABNORMAL HIGH (ref 98–192)

## 2023-01-25 LAB — AMYLASE: Amylase: 27 U/L — ABNORMAL LOW (ref 28–100)

## 2023-01-25 LAB — LIPASE, BLOOD: Lipase: 21 U/L (ref 11–51)

## 2023-01-25 MED ORDER — ACETAMINOPHEN 500 MG PO TABS: 1000.0000 mg | ORAL_TABLET | Freq: Four times a day (QID) | ORAL | Status: AC | PRN

## 2023-01-25 MED ORDER — DOCUSATE SODIUM 100 MG PO CAPS: 100.0000 mg | ORAL_CAPSULE | Freq: Two times a day (BID) | ORAL | Status: DC | PRN

## 2023-01-25 MED ORDER — ACETAMINOPHEN 500 MG PO TABS
1000.0000 mg | ORAL_TABLET | Freq: Four times a day (QID) | ORAL | Status: DC | PRN
  Administered 2023-01-25: 1000 mg via ORAL
  Filled 2023-01-25: qty 2

## 2023-01-25 MED ORDER — CALCIUM CARBONATE ANTACID 500 MG PO CHEW: 2.0000 | CHEWABLE_TABLET | ORAL | Status: DC | PRN

## 2023-01-25 MED ORDER — BETAMETHASONE SOD PHOS & ACET 6 (3-3) MG/ML IJ SUSP
12.0000 mg | Freq: Once | INTRAMUSCULAR | Status: AC
  Administered 2023-01-25: 12 mg via INTRAMUSCULAR
  Filled 2023-01-25: qty 5

## 2023-01-25 MED ORDER — LABETALOL HCL 5 MG/ML IV SOLN: 40.0000 mg | INTRAVENOUS | Status: DC | PRN

## 2023-01-25 MED ORDER — NIFEDIPINE ER OSMOTIC RELEASE 30 MG PO TB24
30.0000 mg | ORAL_TABLET | Freq: Every day | ORAL | Status: DC
  Administered 2023-01-25 – 2023-01-29 (×5): 30 mg via ORAL
  Filled 2023-01-25 (×5): qty 1

## 2023-01-25 MED ORDER — LABETALOL HCL 5 MG/ML IV SOLN: 80.0000 mg | INTRAVENOUS | Status: DC | PRN

## 2023-01-25 MED ORDER — BETAMETHASONE SOD PHOS & ACET 6 (3-3) MG/ML IJ SUSP
INTRAMUSCULAR | Status: AC
  Filled 2023-01-25: qty 5

## 2023-01-25 MED ORDER — MAGNESIUM SULFATE BOLUS VIA INFUSION
4.0000 g | Freq: Once | INTRAVENOUS | Status: AC
  Administered 2023-01-25: 4 g via INTRAVENOUS
  Filled 2023-01-25: qty 1000

## 2023-01-25 MED ORDER — LABETALOL HCL 5 MG/ML IV SOLN: 20.0000 mg | INTRAVENOUS | Status: DC | PRN

## 2023-01-25 MED ORDER — ACETAMINOPHEN 325 MG PO TABS
650.0000 mg | ORAL_TABLET | ORAL | Status: DC | PRN
  Administered 2023-01-26: 650 mg via ORAL
  Filled 2023-01-25: qty 2

## 2023-01-25 MED ORDER — PRENATAL MULTIVITAMIN CH
1.0000 | ORAL_TABLET | Freq: Every day | ORAL | Status: DC
  Administered 2023-01-26: 1 via ORAL
  Filled 2023-01-25: qty 1

## 2023-01-25 MED ORDER — MAGNESIUM SULFATE 40 GM/1000ML IV SOLN
2.0000 g/h | INTRAVENOUS | Status: AC
  Administered 2023-01-25 – 2023-01-26 (×2): 2 g/h via INTRAVENOUS
  Filled 2023-01-25 (×2): qty 1000

## 2023-01-25 MED ORDER — BETAMETHASONE SOD PHOS & ACET 6 (3-3) MG/ML IJ SUSP
12.0000 mg | Freq: Once | INTRAMUSCULAR | Status: AC
  Administered 2023-01-26: 12 mg via INTRAMUSCULAR
  Filled 2023-01-25: qty 5

## 2023-01-25 MED ORDER — HYDRALAZINE HCL 20 MG/ML IJ SOLN: 10.0000 mg | INTRAMUSCULAR | Status: DC | PRN

## 2023-01-25 MED ORDER — LACTATED RINGERS IV SOLN: INTRAVENOUS | Status: DC

## 2023-01-25 MED ORDER — ACETAMINOPHEN-CAFFEINE 500-65 MG PO TABS
2.0000 | ORAL_TABLET | Freq: Once | ORAL | Status: AC
  Administered 2023-01-25: 2 via ORAL
  Filled 2023-01-25: qty 2

## 2023-01-25 MED ORDER — LACTATED RINGERS IV BOLUS
1000.0000 mL | Freq: Once | INTRAVENOUS | Status: AC
  Administered 2023-01-25: 1000 mL via INTRAVENOUS

## 2023-01-25 NOTE — H&P (Signed)
Obstetrics Admission History & Physical  01/25/2023 - 10:02 PM Primary OBGYN: [facility]  Chief Complaint: severe pre-eclampsia (neuro s/s) History of Present Illness  22 y.o. G1P0000 at 103w4d, with the above CC. Pregnancy complicated by: chronic FGR with marginal cord insertion, BMI 40s.  Ms. Brooke Small states that she has headache and some blurry vision and presented to MAU with BPs in the 160s at home today. She was seen overnight/earlier today at [facility] for decreased FM and RUQ pain and had mild range BPs, slight AST elevated at 42, plts 137 and newly elevated PC ratio, as well, of 890 and diagnosed with gallstones. Patient discharged to home with close outpatient follow up  In MAU today, she's had consistent mild range BPs and persistent neuro s/s despite excedrin.   She denies any PTL s/s or decreased FM  Review of Systems: as noted in the History of Present Illness.  Patient Active Problem List   Diagnosis Date Noted   Right upper quadrant pain 01/25/2023   Preeclampsia 01/25/2023   Cholelithiasis affecting pregnancy in third trimester, antepartum 01/25/2023   Gestational thrombocytopenia, third trimester (HCC) 01/25/2023   Severe pre-eclampsia 01/25/2023   BMI 45.0-49.9, adult (HCC) 01/23/2023   Fetal growth restriction antepartum 01/16/2023   Marginal insertion of umbilical cord affecting management of mother 11/01/2022   Rubella non-immune status, antepartum 10/30/2022   Maternal varicella, non-immune 10/30/2022   Obesity affecting pregnancy 09/07/2022   Supervision of high-risk pregnancy 08/17/2022    PMHx:  Past Medical History:  Diagnosis Date   Anxiety 09/06/2015   Asthma    PSHx:  Past Surgical History:  Procedure Laterality Date   TONSILLECTOMY AND ADENOIDECTOMY     Medications:  Medications Prior to Admission  Medication Sig Dispense Refill Last Dose   acetaminophen (TYLENOL) 500 MG tablet Take 2 tablets (1,000 mg total)  by mouth every 6 (six) hours as needed for fever or headache.   01/25/2023 at 1300   aspirin EC 81 MG tablet Take 1 tablet (81 mg total) by mouth daily. Take after 12 weeks for prevention of preeclampssia later in pregnancy 300 tablet 2 01/24/2023   Prenatal Vit-Fe Fumarate-FA (PRENATAL MULTIVITAMIN) TABS tablet Take 1 tablet by mouth daily at 12 noon.   01/24/2023     Allergies: has No Known Allergies. OBHx:  OB History  Gravida Para Term Preterm AB Living  1 0 0 0 0 0  SAB IAB Ectopic Multiple Live Births  0 0 0 0 0    # Outcome Date GA Lbr Len/2nd Weight Sex Type Anes PTL Lv  1 Current               FHx:  Family History  Problem Relation Age of Onset   Diabetes Mother    Healthy Father    Healthy Sister    Healthy Brother    Heart attack Maternal Grandmother    Hypertension Maternal Grandmother    Diabetes Maternal Grandmother    Healthy Maternal Grandfather    Asthma Paternal Grandmother    Diabetes Paternal Grandfather    Liver disease Paternal Grandfather        failure   Heart attack Paternal Grandfather    Soc Hx:  Social History   Socioeconomic History   Marital status: Single    Spouse name: Not on file   Number of children: 0   Years of education: 14   Highest education level: Not on file  Occupational History  Occupation: Psychologist, sport and exercise at [employer]  Tobacco Use   Smoking status: Never   Smokeless tobacco: Never  Vaping Use   Vaping status: Never Used  Substance and Sexual Activity   Alcohol use: Never   Drug use: Never   Sexual activity: Yes    Partners: Male    Birth control/protection: None  Other Topics Concern   Not on file  Social History Narrative   Not on file   Social Determinants of Health   Financial Resource Strain: Low Risk  (08/17/2022)   Overall Financial Resource Strain (CARDIA)    Difficulty of Paying Living Expenses: Not very hard  Food Insecurity: No Food Insecurity (08/17/2022)   Hunger Vital Sign    Worried About Running Out of  Food in the Last Year: Never true    Ran Out of Food in the Last Year: Never true  Transportation Needs: No Transportation Needs (08/17/2022)   PRAPARE - Administrator, Civil Service (Medical): No    Lack of Transportation (Non-Medical): No  Physical Activity: Inactive (08/17/2022)   Exercise Vital Sign    Days of Exercise per Week: 0 days    Minutes of Exercise per Session: 0 min  Stress: No Stress Concern Present (08/17/2022)   [employer] of Occupational Health - Occupational Stress Questionnaire    Feeling of Stress : Not at all  Social Connections: Unknown (08/17/2022)   Social Connection and Isolation Panel [NHANES]    Frequency of Communication with Friends and Family: Three times a week    Frequency of Social Gatherings with Friends and Family: Twice a week    Attends Religious Services: More than 4 times per year    Active Member of Clubs or Organizations: No    Attends Banker Meetings: Never    Marital Status: Not on file  Intimate Partner Violence: Not At Risk (08/17/2022)   Humiliation, Afraid, Rape, and Kick questionnaire    Fear of Current or Ex-Partner: No    Emotionally Abused: No    Physically Abused: No    Sexually Abused: No    Objective  Patient Vitals for the past 12 hrs:  BP Temp Temp src Pulse Resp SpO2 Height Weight  01/25/23 2130 (!) 149/75 -- -- (!) 55 -- -- -- --  01/25/23 2116 (!) 145/87 -- -- 62 -- -- -- --  01/25/23 2101 (!) 141/77 -- -- 60 -- -- -- --  01/25/23 2046 (!) 152/81 -- -- (!) 57 -- -- -- --  01/25/23 1946 (!) 145/78 -- -- 63 -- -- -- --  01/25/23 1931 (!) 147/77 -- -- 61 -- -- -- --  01/25/23 1915 (!) 144/85 -- -- 49 -- -- -- --  01/25/23 1900 (!) 144/85 -- -- 68 -- 99 % -- --  01/25/23 1815 (!) 149/84 -- -- 71 -- -- -- --  01/25/23 1800 (!) 140/82 -- -- 67 -- -- -- --  01/25/23 1745 (!) 144/75 -- -- 68 -- 99 % -- --  01/25/23 1724 (!) 148/82 97.8 F (36.6 C) Oral 76 18 99 % -- --  01/25/23 1722 -- -- --  -- -- -- 5\' 5"  (1.651 m) 127.8 kg    Current Vital Signs 24h Vital Sign Ranges  T 97.8 F (36.6 C) Temp  Avg: 97.8 F (36.6 C)  Min: 97.8 F (36.6 C)  Max: 97.8 F (36.6 C)  BP (!) 149/75 BP  Min: 127/68  Max: 157/83  HR (!) 55 Pulse  Avg: 64.4  Min: 55  Max: 76  RR 18 Resp  Avg: 18  Min: 18  Max: 18  SaO2 99 %   SpO2  Avg: 99 %  Min: 99 %  Max: 99 %       24 Hour I/O Current Shift I/O  Time Ins Outs No intake/output data recorded. No intake/output data recorded.   EFM: 135 baseline, +accels, no decel, mod variability  Toco: quiet  General: Well nourished, well developed female in no acute distress.  Skin:  Warm and dry.  Cardiovascular: S1, S2 normal, no murmur, rub or gallop, regular rate and rhythm Respiratory:  Clear to auscultation bilateral. Normal respiratory effort Abdomen: obese, nttp Neuro/Psych:  Normal mood and affect.   Labs  PC ratio 580 Recent Labs  Lab 01/23/23 1500 01/25/23 0224 01/25/23 1838  WBC 9.0 10.5 8.9  HGB 12.0 11.4* 10.6*  HCT 37.1 34.1* 32.4*  PLT 163 137* 132*    Recent Labs  Lab 01/23/23 1500 01/25/23 0224 01/25/23 1838  NA 138 134* 136  K 4.1 3.7 3.8  CL 106 108 109  CO2 18* 18* 18*  BUN 8 8 9   CREATININE 0.55* 0.47 0.67  CALCIUM 8.7 8.4* 8.5*  PROT 5.6* 6.2* 5.7*  BILITOT 0.3 0.8 0.5  ALKPHOS 113* 90 91  ALT 16 32 30  AST 23 42* 28  GLUCOSE 112* 87 132*    Radiology No new imaging 9/24: UA wnl,  afi 11.5, cephalic, RNST 9/17: cephalic, afi 1.1, 1011gm, ac 4.0%, afi 15, UA elevated dopplers   Assessment & Plan   22 y.o. G1P0000 at [redacted]w[redacted]d with new severe pre-eclampsia; patient stable *Pregnancy: category I tracing, fetal status reassuring. Needs GBS swab at some point *Severe pre-eclampsia: I told her that given new diagnosis that treatment plan is Mg x 24h, serial labs and inpatient until delivery, which she is amenable to. Will start procardia xl 30 qday *Preterm: consult NICU in AM. She got a dose of BMZ at [facility] on  10/3 @ 0430; repeat tomorrow 24h s/p 1st dose *FGR: will repeat dopplers early, tomorrow morning, since new diagnosis today *Gestational thrombocytopenia: continue to follow *Cholelithiasis: no current s/s and AST wnl now. Low fat diet. Will need Gen Surg consult PP *PPx: SCDs *FEN/GI: MIVF and low fat diet *Dispo: in patient until delivery  Cornelia Copa MD Attending [facility] (Faculty Practice) GYN Consult Phone: [phone] (M-F, [phone]) & [phone] (Off hours, weekends, holidays)

## 2023-01-25 NOTE — Progress Notes (Signed)
Baby very active making it somewhat difficult to monitor at times.

## 2023-01-25 NOTE — MAU Provider Note (Signed)
History     CSN: [id number]  Arrival date and time: 01/25/23 1710   None     Chief Complaint  Patient presents with   Hypertension   Headache   HPI  Brooke Small is a 22 y.o. G1P0 at 105w4d here in MAU for evaluation of high blood pressure, vision changes, and headache. Patient reports her blood pressure was normal prior to pregnancy, and she has never been prescribed antihypertensive medications. Her blood pressure was within normal limits until 01/23/2023 when she started had an elevated blood pressure at her routine OB appointments. She was asymptomatic at that time with reassuring labs, so she has been monitoring her blood pressure closely at home and in the office. She was recently transferred from [facility] to [facility] given high risk status. She has not had any symptoms until yesterday when she started experiencing RUQ abdominal pain, headache, and vision changes, for which she presented to the [facility] ED. Workup there was notable for cholelithiasis, elevated BP's and pre-eclampsia (PCR 0.89). She was discharged home as they felt the criteria for severe pre-eclampsia were not met. Once at home, she continued to experience headache, vision changes, and high blood pressures up to 161/99, prompting her to come to the MAU for evaluation.   She describes her vision changes as peripheral vision that is blurry/dim and floaters ("black dots that go back and forth across my eyes"). Her headaches have been on and off over the past 1-2 weeks and somewhat improve with Tylenol. She has recently been taking 500-800 mg Tylenol BID x 3-4 days/week. She last took Tylenol at 1PM and currently rates her headache pain as 8/10. Over the past 2 days, she has also noted SOB with ambulation and worsening upper and lower extremity edema. Pregnancy has otherwise been complicated by fetal growth restriction (1%), morbid obesity, and asthma.  BMZ dose 1 given 10/3 @ 0430          OB History      Gravida  1   Para  0   Term  0   Preterm  0   AB  0   Living  0      SAB  0   IAB  0   Ectopic  0   Multiple  0   Live Births  0           Past Medical History:  Diagnosis Date   Anxiety 09/06/2015   Asthma     Past Surgical History:  Procedure Laterality Date   TONSILLECTOMY AND ADENOIDECTOMY      Family History  Problem Relation Age of Onset   Diabetes Mother    Healthy Father    Healthy Sister    Healthy Brother    Heart attack Maternal Grandmother    Hypertension Maternal Grandmother    Diabetes Maternal Grandmother    Healthy Maternal Grandfather    Asthma Paternal Grandmother    Diabetes Paternal Grandfather    Liver disease Paternal Grandfather        failure   Heart attack Paternal Grandfather     Social History   Tobacco Use   Smoking status: Never   Smokeless tobacco: Never  Vaping Use   Vaping status: Never Used  Substance Use Topics   Alcohol use: Never   Drug use: Never    Allergies: No Known Allergies  Medications Prior to Admission  Medication Sig Dispense Refill Last Dose   acetaminophen (TYLENOL) 500 MG tablet Take 2  tablets (1,000 mg total) by mouth every 6 (six) hours as needed for fever or headache.   01/25/2023 at 1300   aspirin EC 81 MG tablet Take 1 tablet (81 mg total) by mouth daily. Take after 12 weeks for prevention of preeclampssia later in pregnancy 300 tablet 2 01/24/2023   Prenatal Vit-Fe Fumarate-FA (PRENATAL MULTIVITAMIN) TABS tablet Take 1 tablet by mouth daily at 12 noon.   01/24/2023   Results for orders placed or performed during the hospital encounter of 01/25/23 (from the past 48 hour(s))  Protein / creatinine ratio, urine     Status: Abnormal   Collection Time: 01/25/23  6:26 PM  Result Value Ref Range   Creatinine, Urine 186 mg/dL   Total Protein, Urine 108 mg/dL    Comment: NO NORMAL RANGE ESTABLISHED FOR THIS TEST   Protein Creatinine Ratio 0.58 (H) 0.00 - 0.15 mg/mg[Cre]    Comment: Performed  at [facility], [address] [place], [place] [address]  CBC with Differential/Platelet     Status: Abnormal   Collection Time: 01/25/23  6:38 PM  Result Value Ref Range   WBC 8.9 4.0 - 10.5 K/uL   RBC 4.01 3.87 - 5.11 MIL/uL   Hemoglobin 10.6 (L) 12.0 - 15.0 g/dL   HCT 60.4 (L) 54.0 - 98.1 %   MCV 80.8 80.0 - 100.0 fL   MCH 26.4 26.0 - 34.0 pg   MCHC 32.7 30.0 - 36.0 g/dL   RDW 19.1 47.8 - 29.5 %   Platelets 132 (L) 150 - 400 K/uL   nRBC 0.0 0.0 - 0.2 %   Neutrophils Relative % 85 %   Neutro Abs 7.6 1.7 - 7.7 K/uL   Lymphocytes Relative 11 %   Lymphs Abs 0.9 0.7 - 4.0 K/uL   Monocytes Relative 3 %   Monocytes Absolute 0.3 0.1 - 1.0 K/uL   Eosinophils Relative 0 %   Eosinophils Absolute 0.0 0.0 - 0.5 K/uL   Basophils Relative 0 %   Basophils Absolute 0.0 0.0 - 0.1 K/uL   Immature Granulocytes 1 %   Abs Immature Granulocytes 0.09 (H) 0.00 - 0.07 K/uL    Comment: Performed at [facility], [address] [place], [place] [address]  Comprehensive metabolic panel     Status: Abnormal   Collection Time: 01/25/23  6:38 PM  Result Value Ref Range   Sodium 136 135 - 145 mmol/L   Potassium 3.8 3.5 - 5.1 mmol/L   Chloride 109 98 - 111 mmol/L   CO2 18 (L) 22 - 32 mmol/L   Glucose, Bld 132 (H) 70 - 99 mg/dL    Comment: Glucose reference range applies only to samples taken after fasting for at least 8 hours.   BUN 9 6 - 20 mg/dL   Creatinine, Ser 8.65 0.44 - 1.00 mg/dL   Calcium 8.5 (L) 8.9 - 10.3 mg/dL   Total Protein 5.7 (L) 6.5 - 8.1 g/dL   Albumin 2.5 (L) 3.5 - 5.0 g/dL   AST 28 15 - 41 U/L   ALT 30 0 - 44 U/L   Alkaline Phosphatase 91 38 - 126 U/L   Total Bilirubin 0.5 0.3 - 1.2 mg/dL   GFR, Estimated >78 >46 mL/min    Comment: (NOTE) Calculated using the CKD-EPI Creatinine Equation (2021)    Anion gap 9 5 - 15    Comment: Performed at [facility], [address], [place], [place] [address]    Review of  Systems  Eyes:  Positive for photophobia.   Gastrointestinal:  Negative for abdominal pain.  Neurological:  Positive for headaches.   Physical Exam   Blood pressure (!) 145/78, pulse 63, temperature 97.8 F (36.6 C), temperature source Oral, resp. rate 18, height 5\' 5"  (1.651 m), weight 127.8 kg, last menstrual period 06/15/2022, SpO2 99%.  Patient Vitals for the past 24 hrs:  BP Temp Temp src Pulse Resp SpO2 Height Weight  01/25/23 1946 (!) 145/78 -- -- 63 -- -- -- --  01/25/23 1931 (!) 147/77 -- -- 61 -- -- -- --  01/25/23 1915 (!) 144/85 -- -- 47 -- -- -- --  01/25/23 1900 (!) 144/85 -- -- 68 -- 99 % -- --  01/25/23 1815 (!) 149/84 -- -- 71 -- -- -- --  01/25/23 1800 (!) 140/82 -- -- 67 -- -- -- --  01/25/23 1745 (!) 144/75 -- -- 68 -- 99 % -- --  01/25/23 1724 (!) 148/82 97.8 F (36.6 C) Oral 76 18 99 % -- --  01/25/23 1722 -- -- -- -- -- -- 5\' 5"  (1.651 m) 127.8 kg     Physical Exam Constitutional:      General: She is not in acute distress.    Appearance: She is well-developed. She is obese. She is not ill-appearing, toxic-appearing or diaphoretic.  HENT:     Head: Normocephalic.  Pulmonary:     Effort: Pulmonary effort is normal.  Musculoskeletal:        General: Normal range of motion.  Skin:    General: Skin is warm.  Neurological:     Mental Status: She is alert.     GCS: GCS eye subscore is 4. GCS verbal subscore is 5. GCS motor subscore is 6.   Fetal Tracing: Baseline: 140 bpm Variability: moderate  Accelerations: 15x15 Decelerations: None  Toco: None  MAU Course  Procedures None  MDM  PIH labs repeated per Dr. Tarri Glenn request.  CBC, CMP, PCR Excedrin headache given, headache now 4/10 down from 8/10 She continues to have vision changes.  Symptoms concerning for severe preeclampsia, Dr. Vergie Living in the OR and will come to MAU to discuss plan of care with the patient.  Wynelle Bourgeois CNM is aware of the patient in MAU  Rosario Duey, Harolyn Rutherford, NP  Assessment and Plan

## 2023-01-25 NOTE — Discharge Summary (Signed)
Physician Final Progress Note  Patient ID: Brooke Small MRN: [id number] DOB/AGE: [birth year] 20 y.o.  Admit date: 01/25/2023 Admitting provider: Julieanne Manson, MD Discharge date: 01/25/2023   Admission Diagnoses:  1) intrauterine pregnancy at [redacted]w[redacted]d  2) Right upper quadrant pain   Discharge Diagnoses:  Principal Problem:   Right upper quadrant pain Active Problems:   Preeclampsia   Cholelithiasis affecting pregnancy in third trimester, antepartum   History of Present Illness: Brooke Small is a 22 y.o. female presenting for abdominal pain. The pain started 3-4 hours ago, it is in in her right upper abdomen, it is constant with an occasional sharp sensation. She has had some nausea, but this is not new. Denies fevers or diarrhea. She last ate pasta at 7pm. She has not done anything strenuous lately, denies any recent injuries. Last had IC months ago. She took 1 500mg  Tylenol at 7pm for low back pain. She did have a Headache earlier today but currently denies one, she did see floaters today but currently denies any visual disturbances.  She has not felt the fetus move since this morning, despite going to a quiet space and trying to provoke movement.     Mckinna's pregnancy has been complicated by severe IUGR with an EFW in the 1.1 percentile, and a high BMI. She started her care with [facility] but was transferred to [facility] d/t the need to deliver at [facility] secondary to a tenuous airway. She was seen by MFM yesterday, she had a BPP 10/10, she had an elevated BP in the office PIH labs were sent and are normal with a UPC 291. Past Medical History:  Diagnosis Date   Anxiety 09/06/2015   Asthma     Past Surgical History:  Procedure Laterality Date   TONSILLECTOMY AND ADENOIDECTOMY      No current facility-administered medications on file prior to encounter.   Current Outpatient Medications on File Prior to Encounter  Medication Sig Dispense Refill   acetaminophen (TYLENOL) 325 MG  tablet Take 325 mg by mouth every 6 (six) hours as needed for moderate pain.     aspirin EC 81 MG tablet Take 1 tablet (81 mg total) by mouth daily. Take after 12 weeks for prevention of preeclampssia later in pregnancy 300 tablet 2   Prenatal Vit-Fe Fumarate-FA (PRENATAL MULTIVITAMIN) TABS tablet Take 1 tablet by mouth daily at 12 noon.      No Known Allergies  Social History   Socioeconomic History   Marital status: Single    Spouse name: Not on file   Number of children: 0   Years of education: 14   Highest education level: Not on file  Occupational History   Occupation: Psychologist, sport and exercise at [employer]  Tobacco Use   Smoking status: Never   Smokeless tobacco: Never  Vaping Use   Vaping status: Never Used  Substance and Sexual Activity   Alcohol use: Never   Drug use: Never   Sexual activity: Yes    Partners: Male    Birth control/protection: None  Other Topics Concern   Not on file  Social History Narrative   Not on file   Social Determinants of Health   Financial Resource Strain: Low Risk  (08/17/2022)   Overall Financial Resource Strain (CARDIA)    Difficulty of Paying Living Expenses: Not very hard  Food Insecurity: No Food Insecurity (08/17/2022)   Hunger Vital Sign    Worried About Running Out of Food in the Last Year: Never true  Ran Out of Food in the Last Year: Never true  Transportation Needs: No Transportation Needs (08/17/2022)   PRAPARE - Administrator, Civil Service (Medical): No    Lack of Transportation (Non-Medical): No  Physical Activity: Inactive (08/17/2022)   Exercise Vital Sign    Days of Exercise per Week: 0 days    Minutes of Exercise per Session: 0 min  Stress: No Stress Concern Present (08/17/2022)   [employer] of Occupational Health - Occupational Stress Questionnaire    Feeling of Stress : Not at all  Social Connections: Unknown (08/17/2022)   Social Connection and Isolation Panel [NHANES]    Frequency of Communication with  Friends and Family: Three times a week    Frequency of Social Gatherings with Friends and Family: Twice a week    Attends Religious Services: More than 4 times per year    Active Member of [employer] or Organizations: No    Attends Banker Meetings: Never    Marital Status: Not on file  Intimate Partner Violence: Not At Risk (08/17/2022)   Humiliation, Afraid, Rape, and Kick questionnaire    Fear of Current or Ex-Partner: No    Emotionally Abused: No    Physically Abused: No    Sexually Abused: No    Family History  Problem Relation Age of Onset   Diabetes Mother    Healthy Father    Healthy Sister    Healthy Brother    Heart attack Maternal Grandmother    Hypertension Maternal Grandmother    Diabetes Maternal Grandmother    Healthy Maternal Grandfather    Asthma Paternal Grandmother    Diabetes Paternal Grandfather    Liver disease Paternal Grandfather        failure   Heart attack Paternal Grandfather      ROS see HPI   Physical Exam: BP (!) 157/83   Pulse 62   Temp 97.8 F (36.6 C) (Oral)   LMP 06/15/2022 (Exact Date)   Physical Exam Constitutional:      Appearance: Normal appearance.  Cardiovascular:     Rate and Rhythm: Normal rate and regular rhythm.     Pulses: Normal pulses.     Heart sounds: Normal heart sounds.  Pulmonary:     Effort: Pulmonary effort is normal.     Breath sounds: Normal breath sounds.  Abdominal:     Tenderness: There is no guarding.     Comments: Gravid, Tenderness over RUQ  Musculoskeletal:     Cervical back: Normal range of motion.     Right lower leg: Edema present.     Left lower leg: Edema present.  Neurological:     General: No focal deficit present.     Mental Status: She is alert.     Deep Tendon Reflexes: Reflexes normal.  Skin:    General: Skin is warm.  Psychiatric:        Mood and Affect: Mood normal.   [id number] 140, moderate variability, pos accel, neg decel   Consults: None  Significant  Findings/ Diagnostic Studies: UPC .89, platelets 137, AST 42 all other labs WNL.  [place]: 1. Cholelithiasis without features of acute cholecystitis. 2. Trace right pleural fluid, suggest chest radiograph.  Procedures: RNST   Hospital Course: The patient was admitted to Labor and Delivery Triage for observation. She was given 1,000mg  Tylenol, which improved her pain some. She was given a LR bolus and 1 dose of betamethasone. She had a RNST. After some time,  she reported she did feel FM. US showed a normal liver, 14mm gallstone, and trace right pleual fluid. Reviewed pt's history, labs and [place] with Dr Lonny Prude. Per Dr Lonny Prude pt does not need a chest x ray. She has preeclampsia but does not meet criteria for severe preeclampsia, therefore she can be discharged home with close follow up with her OB provider. Reviewed recommendations with pt and her family. Encouraged pt to reach out to her OB provider this morning. Please return in about 24 hours for repeat dose of betamethasone.   Discharge Condition: stable  Disposition: Discharge disposition: 01-Home or Self Care       Diet: Regular diet  Discharge Activity: Activity as tolerated   Allergies as of 01/25/2023   No Known Allergies      Medication List     TAKE these medications    acetaminophen 500 MG tablet Commonly known as: TYLENOL Take 2 tablets (1,000 mg total) by mouth every 6 (six) hours as needed for fever or headache. What changed:  medication strength how much to take reasons to take this   aspirin EC 81 MG tablet Take 1 tablet (81 mg total) by mouth daily. Take after 12 weeks for prevention of preeclampssia later in pregnancy   prenatal multivitamin Tabs tablet Take 1 tablet by mouth daily at 12 noon.         Total time spent taking care of this patient: 30 minutes  Signed: Ellouise Newer [facility], CNM  01/25/2023, 5:23 AM

## 2023-01-25 NOTE — MAU Provider Note (Signed)
History   CSN: [id number]  Arrival date and time: 01/25/23 1710   Chief Complaint  Patient presents with   Hypertension   Headache   HPI Brooke Small is a 22 y.o. G1P0 at [redacted]w[redacted]d here in MAU for evaluation of high blood pressure, vision changes, and headache. Patient reports her blood pressure was normal prior to pregnancy, and she has never been prescribed antihypertensive medications. Her blood pressure was within normal limits until 01/23/2023 when she started had an elevated blood pressure at her routine OB appointments. She was asymptomatic at that time with reassuring labs, so she has been monitoring her blood pressure closely at home. She has not had any symptoms until yesterday when she started experiencing RUQ abdominal pain, headache, and vision changes, for which she presented to the [facility] ED. Workup there was notable for cholelithiasis and pre-eclampsia (PCR 0.89). She was discharged home as they felt the criteria for severe pre-eclampsia were not met. Once at home, she continued to experience headache, vision changes, and high blood pressures up to 161/99, prompting her to come to the MAU for evaluation. She describes her vision changes as peripheral vision that is blurry/dim and floaters ("black dots that go back and forth across my eyes"). Her headaches have been on and off over the past 1-2 weeks and somewhat improve with Tylenol. She has recently been taking 500-800 mg Tylenol BID x 3-4 days/week. She last took Tylenol at 1PM and currently rates her headache pain as 8/10. Over the past 2 days, she has also noted SOB with ambulation and worsening upper and lower extremity edema. Pregnancy has otherwise been complicated by fetal growth restriction, obesity, and asthma.  Past Medical History:  Diagnosis Date   Anxiety 09/06/2015   Asthma     Past Surgical History:  Procedure Laterality Date   TONSILLECTOMY AND ADENOIDECTOMY      Family History  Problem Relation Age of Onset    Diabetes Mother    Healthy Father    Healthy Sister    Healthy Brother    Heart attack Maternal Grandmother    Hypertension Maternal Grandmother    Diabetes Maternal Grandmother    Healthy Maternal Grandfather    Asthma Paternal Grandmother    Diabetes Paternal Grandfather    Liver disease Paternal Grandfather        failure   Heart attack Paternal Grandfather     Social History   Tobacco Use   Smoking status: Never   Smokeless tobacco: Never  Vaping Use   Vaping status: Never Used  Substance Use Topics   Alcohol use: Never   Drug use: Never    Allergies: No Known Allergies  Medications Prior to Admission  Medication Sig Dispense Refill Last Dose   acetaminophen (TYLENOL) 500 MG tablet Take 2 tablets (1,000 mg total) by mouth every 6 (six) hours as needed for fever or headache.   01/25/2023 at 1300   aspirin EC 81 MG tablet Take 1 tablet (81 mg total) by mouth daily. Take after 12 weeks for prevention of preeclampssia later in pregnancy 300 tablet 2 01/24/2023   Prenatal Vit-Fe Fumarate-FA (PRENATAL MULTIVITAMIN) TABS tablet Take 1 tablet by mouth daily at 12 noon.   01/24/2023    Review of Systems  Constitutional:  Negative for chills and fever.  Eyes:  Positive for visual disturbance. Negative for photophobia.  Respiratory:  Positive for shortness of breath.   Cardiovascular:  Positive for leg swelling. Negative for chest pain.  Gastrointestinal:  Negative for  abdominal pain.  Genitourinary:  Negative for vaginal bleeding and vaginal discharge.  Neurological:  Positive for headaches. Negative for seizures.   Physical Exam   Blood pressure (!) 145/78, pulse 63, temperature 97.8 F (36.6 C), temperature source Oral, resp. rate 18, height 5\' 5"  (1.651 m), weight 127.8 kg, last menstrual period 06/15/2022, SpO2 99%.  Physical Exam Vitals and nursing note reviewed.  Constitutional:      General: She is not in acute distress.    Appearance: She is obese. She is not  ill-appearing.  HENT:     Head: Normocephalic and atraumatic.  Eyes:     General: No scleral icterus.    Extraocular Movements: Extraocular movements intact.     Conjunctiva/sclera: Conjunctivae normal.     Pupils: Pupils are equal, round, and reactive to light.  Cardiovascular:     Rate and Rhythm: Normal rate and regular rhythm.     Heart sounds: Normal heart sounds.  Pulmonary:     Effort: Pulmonary effort is normal. No respiratory distress.     Breath sounds: Normal breath sounds.  Abdominal:     Tenderness: There is no abdominal tenderness. There is no guarding.  Musculoskeletal:     Right lower leg: Edema present.     Left lower leg: Edema present.  Skin:    General: Skin is warm and dry.  Neurological:     Mental Status: She is alert and oriented to person, place, and time.  Psychiatric:        Mood and Affect: Mood normal.        Behavior: Behavior normal. Behavior is cooperative.   MAU Course   Results for orders placed or performed during the hospital encounter of 01/25/23 (from the past 24 hour(s))  Protein / creatinine ratio, urine     Status: Abnormal   Collection Time: 01/25/23  6:26 PM  Result Value Ref Range   Creatinine, Urine 186 mg/dL   Total Protein, Urine 108 mg/dL   Protein Creatinine Ratio 0.58 (H) 0.00 - 0.15 mg/mg[Cre]  CBC with Differential/Platelet     Status: Abnormal   Collection Time: 01/25/23  6:38 PM  Result Value Ref Range   WBC 8.9 4.0 - 10.5 K/uL   RBC 4.01 3.87 - 5.11 MIL/uL   Hemoglobin 10.6 (L) 12.0 - 15.0 g/dL   HCT 16.1 (L) 09.6 - 04.5 %   MCV 80.8 80.0 - 100.0 fL   MCH 26.4 26.0 - 34.0 pg   MCHC 32.7 30.0 - 36.0 g/dL   RDW 40.9 81.1 - 91.4 %   Platelets 132 (L) 150 - 400 K/uL   nRBC 0.0 0.0 - 0.2 %   Neutrophils Relative % 85 %   Neutro Abs 7.6 1.7 - 7.7 K/uL   Lymphocytes Relative 11 %   Lymphs Abs 0.9 0.7 - 4.0 K/uL   Monocytes Relative 3 %   Monocytes Absolute 0.3 0.1 - 1.0 K/uL   Eosinophils Relative 0 %    Eosinophils Absolute 0.0 0.0 - 0.5 K/uL   Basophils Relative 0 %   Basophils Absolute 0.0 0.0 - 0.1 K/uL   Immature Granulocytes 1 %   Abs Immature Granulocytes 0.09 (H) 0.00 - 0.07 K/uL  Comprehensive metabolic panel     Status: Abnormal   Collection Time: 01/25/23  6:38 PM  Result Value Ref Range   Sodium 136 135 - 145 mmol/L   Potassium 3.8 3.5 - 5.1 mmol/L   Chloride 109 98 - 111 mmol/L   CO2  18 (L) 22 - 32 mmol/L   Glucose, Bld 132 (H) 70 - 99 mg/dL   BUN 9 6 - 20 mg/dL   Creatinine, Ser 1.91 0.44 - 1.00 mg/dL   Calcium 8.5 (L) 8.9 - 10.3 mg/dL   Total Protein 5.7 (L) 6.5 - 8.1 g/dL   Albumin 2.5 (L) 3.5 - 5.0 g/dL   AST 28 15 - 41 U/L   ALT 30 0 - 44 U/L   Alkaline Phosphatase 91 38 - 126 U/L   Total Bilirubin 0.5 0.3 - 1.2 mg/dL   GFR, Estimated >47 >82 mL/min   Anion gap 9 5 - 15    MDM Tienna Bienkowski is a 22 y.o. G1P0 at [redacted]w[redacted]d here in MAU for evaluation of high blood pressure, vision changes, and headache in the setting of recently diagnosed pre-eclampsia. Blood pressures elevated to 140/80s - vital signs otherwise stable. Labs notable for platelets 132 and PCR 0.58. Creatinine, AST, and ALT within normal limits. Patient received Excedrin Tension x 1 and reported improvement in her headache from an 8/10 to 4/10. Given new onset vision changes in the setting of known pre-eclampsia, patient meets criteria for severe pre-eclampsia. Recommend admission to antepartum unit for blood pressure monitoring and repeat labs.   Assessment and Plan  [redacted] weeks gestation of pregnancy - Received betamethasone x 1 at [facility] ED - Early Hgb A1c 5.7, 1hr GTT 138 -> repeat 1hr GTT  Severe pre-eclampsia, antepartum - Max blood pressure 149/84 - CBC: platelets 132 - PCR 0.58 - CMP: creatinine, AST, ALT within normal limits  Whitman Hero 01/25/2023, 8:02 PM

## 2023-01-25 NOTE — H&P (Signed)
Brooke Small is a 22 y.o. female presenting for abdominal pain. The pain started 3-4 hours ago, it is in in her right upper abdomen, it is constant with an occasional sharp sensation. She has had some nausea, but this is not new. Denies fevers or diarrhea. She last ate pasta at 7pm. She has not done anything strenuous lately, denies any recent injuries. Last had IC months ago. She took 1 500mg  Tylenol at 7pm for low back pain. She did have a Headache earlier today but currently denies one, she did see floaters today but currently denies any visual disturbances.  She has not felt the fetus move since this morning, despite going to a quiet space and trying to provoke movement.    Brooke Small pregnancy has been complicated by severe IUGR with an EFW in the 1.1 percentile, and a high BMI. She started her care with [facility] but was transferred to [facility] d/t the need to deliver at [facility] secondary to a tenuous airway. She was seen by MFM yesterday, she had a BPP 10/10, she had an elevated BP in the office PIH labs were sent and are normal with a UPC 291.  OB History     Gravida  1   Para  0   Term  0   Preterm  0   AB  0   Living  0      SAB  0   IAB  0   Ectopic  0   Multiple  0   Live Births  0          Past Medical History:  Diagnosis Date   Anxiety 09/06/2015   Asthma    Past Surgical History:  Procedure Laterality Date   TONSILLECTOMY AND ADENOIDECTOMY     Family History: family history includes Asthma in her paternal grandmother; Diabetes in her maternal grandmother, mother, and paternal grandfather; Healthy in her brother, father, maternal grandfather, and sister; Heart attack in her maternal grandmother and paternal grandfather; Hypertension in her maternal grandmother; Liver disease in her paternal grandfather. Social History:  reports that she has never smoked. She has never used smokeless tobacco. She reports that she does not drink alcohol and does not use  drugs.     Maternal Diabetes: No Genetic Screening: Normal Maternal Ultrasounds/Referrals: IUGR Fetal Ultrasounds or other Referrals:  Referred to Materal Fetal Medicine  Maternal Substance Abuse:  No Significant Maternal Medications:  None Maternal Vaccinations:TDap and Flu CBC    Component Value Date/Time   WBC 10.5 01/25/2023 0224   RBC 4.17 01/25/2023 0224   HGB 11.4 (L) 01/25/2023 0224   HGB 12.0 01/23/2023 1500   HCT 34.1 (L) 01/25/2023 0224   HCT 37.1 01/23/2023 1500   PLT 137 (L) 01/25/2023 0224   PLT 163 01/23/2023 1500   MCV 81.8 01/25/2023 0224   MCV 85 01/23/2023 1500   MCH 27.3 01/25/2023 0224   MCHC 33.4 01/25/2023 0224   RDW 12.9 01/25/2023 0224   RDW 13.1 01/23/2023 1500   LYMPHSABS 1.6 12/22/2022 1102   EOSABS 0.1 12/22/2022 1102   BASOSABS 0.0 12/22/2022 1102      Latest Ref Rng & Units 01/25/2023    2:24 AM 01/23/2023    3:00 PM 10/10/2022   11:09 AM  CMP  Glucose 70 - 99 mg/dL 87  295  621   BUN 6 - 20 mg/dL 8  8  5    Creatinine 0.44 - 1.00 mg/dL 3.08  6.57  8.46  Sodium 135 - 145 mmol/L 134  138  139   Potassium 3.5 - 5.1 mmol/L 3.7  4.1  4.1   Chloride 98 - 111 mmol/L 108  106  103   CO2 22 - 32 mmol/L 18  18  21    Calcium 8.9 - 10.3 mg/dL 8.4  8.7  8.9   Total Protein 6.5 - 8.1 g/dL 6.2  5.6  6.0   Total Bilirubin 0.3 - 1.2 mg/dL 0.8  0.3  0.2   Alkaline Phos 38 - 126 U/L 90  113  54   AST 15 - 41 U/L 42  23  13   ALT 0 - 44 U/L 32  16  16     UPC .89  US Abdomen: IMPRESSION: 1. Cholelithiasis without features of acute cholecystitis. 2. Trace right pleural fluid, suggest chest radiograph.  Review of Systems  Constitutional: Negative.   Eyes: Negative.   Respiratory: Negative.    Cardiovascular: Negative.   Gastrointestinal:  Positive for abdominal pain and nausea. Negative for diarrhea and vomiting.  Endocrine: Negative.   Musculoskeletal: Negative.   Neurological: Negative.   Psychiatric/Behavioral: Negative.     Maternal  Medical History:  Reason for admission: Nausea.       Blood pressure (!) 157/83, pulse 62, temperature 97.8 F (36.6 C), temperature source Oral, last menstrual period 06/15/2022. Exam Physical Exam Constitutional:      Appearance: She is well-developed.  Cardiovascular:     Rate and Rhythm: Normal rate and regular rhythm.     Heart sounds: Normal heart sounds.  Pulmonary:     Breath sounds: Normal breath sounds.  Abdominal:     Comments: Gravid, tenderness over RUQ with palpation  Non tender in lower abdomen   Musculoskeletal:     Right lower leg: Edema present.     Left lower leg: Edema present.  Skin:    General: Skin is warm.  Neurological:     General: No focal deficit present.     Mental Status: She is alert.     Deep Tendon Reflexes: Reflexes normal.  Psychiatric:        Mood and Affect: Mood normal.    EFM: baseline 140, moderate variability, pos accel, neg decel TOCO: no contractions  Prenatal labs: ABO, Rh: A/Positive/-- (05/09 1630) Antibody: Negative (05/09 1630) Rubella: <0.90 (05/09 1630) RPR: Non Reactive (08/30 1102)  HBsAg: Negative (05/09 1630)  HIV: Non Reactive (08/30 1102)  GBS:     Assessment/Plan: Preeclampsia -Betamethasone given, repeat in 24 hours -Reviewed Pt's history, labs, and US findings with Dr Lonny Prude. Per Dr Lonny Prude ok to discharge pt home and to follow up with her OB provider.   Ellouise Newer [facility] 01/25/2023, 4:33 AM

## 2023-01-25 NOTE — OB Triage Note (Signed)
Brooke Small 22 y.o. a G1P0 at 31wk4d presents to Labor & Delivery triage via wheelchair steered by ED staff reporting abdominal pain and decreased fetal movement. Patient describes abdominal pain as sharp and tightening and rates it 7/10. She denies vaginal bleeding or discharge. External FM and TOCO applied to tender abdomen. Initial FHR 140bpm. Vital signs obtained and within normal limits. Patient oriented to care environment including call bell and bed control use. Dominic, CNM notified of patient's arrival.

## 2023-01-25 NOTE — MAU Note (Signed)
.  Brooke Small is a 22 y.o. at [redacted]w[redacted]d here in MAU reporting: Elevated BP's at home. Patient reports she has been monitoring her BP over the last hour and reports it has been gradually increasing with the highest being 161/99. Reports a HA. Reports occasional visual floaters. Denies RUQ/epigastric pain. Denies VB or LOF. +FM.  Last took 500mg  Tylenol around 1300.  IUGR. Pre-E and Cholelithiasis dx today at [facility].   Onset of complaint: Today Pain score: 7/10 HA   FHT: 157 initial external Lab orders placed from triage: UA

## 2023-01-26 ENCOUNTER — Ambulatory Visit: Payer: 59

## 2023-01-26 ENCOUNTER — Encounter (HOSPITAL_COMMUNITY): Payer: Self-pay | Admitting: Obstetrics and Gynecology

## 2023-01-26 ENCOUNTER — Inpatient Hospital Stay (HOSPITAL_COMMUNITY): Payer: 59

## 2023-01-26 DIAGNOSIS — O43193 Other malformation of placenta, third trimester: Secondary | ICD-10-CM | POA: Diagnosis not present

## 2023-01-26 DIAGNOSIS — O99213 Obesity complicating pregnancy, third trimester: Secondary | ICD-10-CM

## 2023-01-26 DIAGNOSIS — E669 Obesity, unspecified: Secondary | ICD-10-CM | POA: Diagnosis not present

## 2023-01-26 DIAGNOSIS — Z3A31 31 weeks gestation of pregnancy: Secondary | ICD-10-CM | POA: Diagnosis not present

## 2023-01-26 DIAGNOSIS — O1413 Severe pre-eclampsia, third trimester: Secondary | ICD-10-CM | POA: Diagnosis not present

## 2023-01-26 DIAGNOSIS — O365930 Maternal care for other known or suspected poor fetal growth, third trimester, not applicable or unspecified: Secondary | ICD-10-CM

## 2023-01-26 LAB — COMPREHENSIVE METABOLIC PANEL
ALT: 32 U/L (ref 0–44)
AST: 28 U/L (ref 15–41)
Albumin: 2.5 g/dL — ABNORMAL LOW (ref 3.5–5.0)
Alkaline Phosphatase: 86 U/L (ref 38–126)
Anion gap: 9 (ref 5–15)
BUN: 8 mg/dL (ref 6–20)
CO2: 17 mmol/L — ABNORMAL LOW (ref 22–32)
Calcium: 7.5 mg/dL — ABNORMAL LOW (ref 8.9–10.3)
Chloride: 107 mmol/L (ref 98–111)
Creatinine, Ser: 0.62 mg/dL (ref 0.44–1.00)
GFR, Estimated: >60 mL/min (ref >60)
Glucose, Bld: 155 mg/dL — ABNORMAL HIGH (ref 70–99)
Potassium: 3.7 mmol/L (ref 3.5–5.1)
Sodium: 133 mmol/L — ABNORMAL LOW (ref 135–145)
Total Bilirubin: 0.4 mg/dL (ref 0.3–1.2)
Total Protein: 5.7 g/dL — ABNORMAL LOW (ref 6.5–8.1)

## 2023-01-26 LAB — CBC WITH DIFFERENTIAL/PLATELET
Abs Immature Granulocytes: 0.12 10*3/uL — ABNORMAL HIGH (ref 0.00–0.07)
Basophils Absolute: 0 10*3/uL (ref 0.0–0.1)
Basophils Relative: 0 %
Eosinophils Absolute: 0 10*3/uL (ref 0.0–0.5)
Eosinophils Relative: 0 %
HCT: 36.8 % (ref 36.0–46.0)
Hemoglobin: 12.2 g/dL (ref 12.0–15.0)
Immature Granulocytes: 1 %
Lymphocytes Relative: 12 %
Lymphs Abs: 1.1 10*3/uL (ref 0.7–4.0)
MCH: 27.4 pg (ref 26.0–34.0)
MCHC: 33.2 g/dL (ref 30.0–36.0)
MCV: 82.7 fL (ref 80.0–100.0)
Monocytes Absolute: 0.2 10*3/uL (ref 0.1–1.0)
Monocytes Relative: 2 %
Neutro Abs: 7.8 10*3/uL — ABNORMAL HIGH (ref 1.7–7.7)
Neutrophils Relative %: 85 %
Platelets: 119 10*3/uL — ABNORMAL LOW (ref 150–400)
RBC: 4.45 MIL/uL (ref 3.87–5.11)
RDW: 13.1 % (ref 11.5–15.5)
WBC: 9.2 10*3/uL (ref 4.0–10.5)
nRBC: 0 % (ref 0.0–0.2)

## 2023-01-26 LAB — GLUCOSE, CAPILLARY
Glucose-Capillary: 173 mg/dL — ABNORMAL HIGH (ref 70–99)
Glucose-Capillary: 153 mg/dL — ABNORMAL HIGH (ref 70–99)
Glucose-Capillary: 128 mg/dL — ABNORMAL HIGH (ref 70–99)

## 2023-01-26 LAB — GROUP B STREP BY PCR: Group B strep by PCR: NEGATIVE

## 2023-01-26 MED ORDER — OXYTOCIN BOLUS FROM INFUSION
333.0000 mL | Freq: Once | INTRAVENOUS | Status: AC
  Administered 2023-01-28: 333 mL via INTRAVENOUS

## 2023-01-26 MED ORDER — INSULIN NPH (HUMAN) (ISOPHANE) 100 UNIT/ML ~~LOC~~ SUSP
10.0000 [IU] | Freq: Two times a day (BID) | SUBCUTANEOUS | Status: DC
  Filled 2023-01-26: qty 10

## 2023-01-26 MED ORDER — TERBUTALINE SULFATE 1 MG/ML IJ SOLN: 0.2500 mg | Freq: Once | INTRAMUSCULAR | Status: DC | PRN

## 2023-01-26 MED ORDER — LIDOCAINE HCL (PF) 1 % IJ SOLN: 30.0000 mL | INTRAMUSCULAR | Status: DC | PRN

## 2023-01-26 MED ORDER — OXYTOCIN-SODIUM CHLORIDE 30-0.9 UT/500ML-% IV SOLN
INTRAVENOUS | Status: AC
  Filled 2023-01-26: qty 500

## 2023-01-26 MED ORDER — INSULIN ASPART 100 UNIT/ML IJ SOLN
0.0000 [IU] | INTRAMUSCULAR | Status: DC
  Administered 2023-01-26: 2 [IU] via SUBCUTANEOUS
  Administered 2023-01-27: 1 [IU] via SUBCUTANEOUS

## 2023-01-26 MED ORDER — LACTATED RINGERS IV SOLN
500.0000 mL | INTRAVENOUS | Status: DC | PRN
  Administered 2023-01-27: 500 mL via INTRAVENOUS

## 2023-01-26 MED ORDER — MAGNESIUM SULFATE 40 GM/1000ML IV SOLN
2.0000 g/h | INTRAVENOUS | Status: AC
  Administered 2023-01-27 – 2023-01-29 (×3): 2 g/h via INTRAVENOUS
  Filled 2023-01-26 (×3): qty 1000

## 2023-01-26 MED ORDER — SOD CITRATE-CITRIC ACID 500-334 MG/5ML PO SOLN: 30.0000 mL | ORAL | Status: DC | PRN

## 2023-01-26 MED ORDER — LACTATED RINGERS IV SOLN: INTRAVENOUS | Status: DC

## 2023-01-26 MED ORDER — FENTANYL CITRATE (PF) 100 MCG/2ML IJ SOLN
50.0000 ug | INTRAMUSCULAR | Status: DC | PRN
  Administered 2023-01-27 – 2023-01-28 (×2): 100 ug via INTRAVENOUS
  Filled 2023-01-26 (×2): qty 2

## 2023-01-26 MED ORDER — INSULIN ASPART 100 UNIT/ML IJ SOLN
0.0000 [IU] | Freq: Three times a day (TID) | INTRAMUSCULAR | Status: DC
  Administered 2023-01-26: 2 [IU] via SUBCUTANEOUS
  Administered 2023-01-26: 3 [IU] via SUBCUTANEOUS

## 2023-01-26 MED ORDER — ACETAMINOPHEN 325 MG PO TABS: 650.0000 mg | ORAL_TABLET | ORAL | Status: DC | PRN

## 2023-01-26 MED ORDER — OXYTOCIN-SODIUM CHLORIDE 30-0.9 UT/500ML-% IV SOLN
1.0000 m[IU]/min | INTRAVENOUS | Status: DC
  Administered 2023-01-26: 1 m[IU]/min via INTRAVENOUS
  Administered 2023-01-28: 19 m[IU]/min via INTRAVENOUS
  Filled 2023-01-26: qty 500

## 2023-01-26 MED ORDER — ONDANSETRON HCL 4 MG/2ML IJ SOLN: 4.0000 mg | Freq: Four times a day (QID) | INTRAMUSCULAR | Status: DC | PRN

## 2023-01-26 MED ORDER — INSULIN ASPART 100 UNIT/ML IJ SOLN
5.0000 [IU] | Freq: Three times a day (TID) | INTRAMUSCULAR | Status: DC
  Administered 2023-01-26: 5 [IU] via SUBCUTANEOUS

## 2023-01-26 MED ORDER — CYCLOBENZAPRINE HCL 10 MG PO TABS
10.0000 mg | ORAL_TABLET | Freq: Once | ORAL | Status: AC
  Administered 2023-01-26: 10 mg via ORAL
  Filled 2023-01-26: qty 1

## 2023-01-26 MED ORDER — OXYTOCIN-SODIUM CHLORIDE 30-0.9 UT/500ML-% IV SOLN
2.5000 [IU]/h | INTRAVENOUS | Status: DC
  Administered 2023-01-28: 2.5 [IU]/h via INTRAVENOUS

## 2023-01-26 NOTE — Progress Notes (Signed)
Patient ID: Brooke Small, female   DOB: 11-01-2000, 22 y.o.   MRN: [id number] FACULTY PRACTICE ANTEPARTUM(COMPREHENSIVE) NOTE  Brooke Small is a 22 y.o. G1P0000 at [redacted]w[redacted]d by best clinical estimate who is admitted for pre-eclampsia with severe features.   Fetal presentation is cephalic. Length of Stay:  1  Days  ASSESSMENT: Principal Problem:   Severe pre-eclampsia Active Problems:   Gestational thrombocytopenia, third trimester (HCC)   PLAN: Worsening labs S/p BMZ x 48 hours tonight Move to delivery on list for IOL On Magnesium On Nifedipine SSI with long and short acting insulins CBGs checking  Subjective: Still having headache Patient reports the fetal movement as active. Patient reports uterine contraction  activity as none. Patient reports  vaginal bleeding as none. Patient describes fluid per vagina as None.  Vitals:  Blood pressure 112/71, pulse 88, temperature 97.9 F (36.6 C), temperature source Oral, resp. rate 17, height 5\' 5"  (1.651 m), weight 127.8 kg, last menstrual period 06/15/2022, SpO2 98%. Physical Examination:  General appearance - alert, well appearing, and in no distress Chest - normal effort Abdomen - gravid, non-tender Fundal Height:  size equals dates Extremities: Homans sign is negative, no sign of DVT  Membranes:intact  Fetal Monitoring:  Baseline: 130 bpm, Variability: Good {> 6 bpm), Accelerations: Reactive, and Decelerations: Absent  Labs:  Results for orders placed or performed during the hospital encounter of 01/25/23 (from the past 24 hour(s))  Protein / creatinine ratio, urine   Collection Time: 01/25/23  6:26 PM  Result Value Ref Range   Creatinine, Urine 186 mg/dL   Total Protein, Urine 108 mg/dL   Protein Creatinine Ratio 0.58 (H) 0.00 - 0.15 mg/mg[Cre]  CBC with Differential/Platelet   Collection Time: 01/25/23  6:38 PM  Result Value Ref Range   WBC 8.9 4.0 - 10.5 K/uL   RBC 4.01 3.87 - 5.11 MIL/uL   Hemoglobin 10.6 (L)  12.0 - 15.0 g/dL   HCT 40.9 (L) 81.1 - 91.4 %   MCV 80.8 80.0 - 100.0 fL   MCH 26.4 26.0 - 34.0 pg   MCHC 32.7 30.0 - 36.0 g/dL   RDW 78.2 95.6 - 21.3 %   Platelets 132 (L) 150 - 400 K/uL   nRBC 0.0 0.0 - 0.2 %   Neutrophils Relative % 85 %   Neutro Abs 7.6 1.7 - 7.7 K/uL   Lymphocytes Relative 11 %   Lymphs Abs 0.9 0.7 - 4.0 K/uL   Monocytes Relative 3 %   Monocytes Absolute 0.3 0.1 - 1.0 K/uL   Eosinophils Relative 0 %   Eosinophils Absolute 0.0 0.0 - 0.5 K/uL   Basophils Relative 0 %   Basophils Absolute 0.0 0.0 - 0.1 K/uL   Immature Granulocytes 1 %   Abs Immature Granulocytes 0.09 (H) 0.00 - 0.07 K/uL  Comprehensive metabolic panel   Collection Time: 01/25/23  6:38 PM  Result Value Ref Range   Sodium 136 135 - 145 mmol/L   Potassium 3.8 3.5 - 5.1 mmol/L   Chloride 109 98 - 111 mmol/L   CO2 18 (L) 22 - 32 mmol/L   Glucose, Bld 132 (H) 70 - 99 mg/dL   BUN 9 6 - 20 mg/dL   Creatinine, Ser 0.86 0.44 - 1.00 mg/dL   Calcium 8.5 (L) 8.9 - 10.3 mg/dL   Total Protein 5.7 (L) 6.5 - 8.1 g/dL   Albumin 2.5 (L) 3.5 - 5.0 g/dL   AST 28 15 - 41 U/L   ALT 30  0 - 44 U/L   Alkaline Phosphatase 91 38 - 126 U/L   Total Bilirubin 0.5 0.3 - 1.2 mg/dL   GFR, Estimated >16 >10 mL/min   Anion gap 9 5 - 15  CBC with Differential/Platelet   Collection Time: 01/26/23 11:03 AM  Result Value Ref Range   WBC 9.2 4.0 - 10.5 K/uL   RBC 4.45 3.87 - 5.11 MIL/uL   Hemoglobin 12.2 12.0 - 15.0 g/dL   HCT 96.0 45.4 - 09.8 %   MCV 82.7 80.0 - 100.0 fL   MCH 27.4 26.0 - 34.0 pg   MCHC 33.2 30.0 - 36.0 g/dL   RDW 11.9 14.7 - 82.9 %   Platelets 119 (L) 150 - 400 K/uL   nRBC 0.0 0.0 - 0.2 %   Neutrophils Relative % 85 %   Neutro Abs 7.8 (H) 1.7 - 7.7 K/uL   Lymphocytes Relative 12 %   Lymphs Abs 1.1 0.7 - 4.0 K/uL   Monocytes Relative 2 %   Monocytes Absolute 0.2 0.1 - 1.0 K/uL   Eosinophils Relative 0 %   Eosinophils Absolute 0.0 0.0 - 0.5 K/uL   Basophils Relative 0 %   Basophils Absolute 0.0  0.0 - 0.1 K/uL   Immature Granulocytes 1 %   Abs Immature Granulocytes 0.12 (H) 0.00 - 0.07 K/uL  Comprehensive metabolic panel   Collection Time: 01/26/23 11:03 AM  Result Value Ref Range   Sodium 133 (L) 135 - 145 mmol/L   Potassium 3.7 3.5 - 5.1 mmol/L   Chloride 107 98 - 111 mmol/L   CO2 17 (L) 22 - 32 mmol/L   Glucose, Bld 155 (H) 70 - 99 mg/dL   BUN 8 6 - 20 mg/dL   Creatinine, Ser 5.62 0.44 - 1.00 mg/dL   Calcium 7.5 (L) 8.9 - 10.3 mg/dL   Total Protein 5.7 (L) 6.5 - 8.1 g/dL   Albumin 2.5 (L) 3.5 - 5.0 g/dL   AST 28 15 - 41 U/L   ALT 32 0 - 44 U/L   Alkaline Phosphatase 86 38 - 126 U/L   Total Bilirubin 0.4 0.3 - 1.2 mg/dL   GFR, Estimated >13 >08 mL/min   Anion gap 9 5 - 15  Glucose, capillary   Collection Time: 01/26/23 12:03 PM  Result Value Ref Range   Glucose-Capillary 173 (H) 70 - 99 mg/dL    Imaging Studies:      Medications:  Scheduled  insulin aspart  0-14 Units Subcutaneous TID PC   insulin aspart  5 Units Subcutaneous TID WC   insulin NPH Human  10 Units Subcutaneous BID AC & HS   NIFEdipine  30 mg Oral Daily   prenatal multivitamin  1 tablet Oral Q1200   I have reviewed the patient's current medications.   Reva Bores, MD 01/26/2023,5:07 PM

## 2023-01-26 NOTE — Progress Notes (Signed)
Patient ID: Brooke Small, female   DOB: 04/22/2001, 22 y.o.   MRN: [id number] Pt transferred to L & D for IOL due to [facility], IUGR and gestational thrombocytopenia She currently has no complaints. Reports + FM. Denies VB or LOF  PE AF VSS Lungs clear Heart RRR Abd soft + BS SVE vertex, closed, 50 %  Ext trace edema  FHT 130's min variability, no decels, no ut ctx  A/P IUP 31 5/7 weeks        SPEC         IUGR         Gestational thrombocytopenia  IOL reviewed with pt.  POC reviewed with pt and she is agreeable to IOL Will do CST to verify fetal tolerance and if does will proceed with pitocin 1:1 to max of 10. Continue with magnesium. Follow CBG's and manage accordingly

## 2023-01-26 NOTE — Consult Note (Signed)
Neonatology Consult  Note:  At the request of the patients obstetrician Dr. Vergie Living I met with Ms. Brooke Small who is a 22 y.o. G1P0000 at [redacted]w[redacted]d, with severe pre-eclampsia.  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 She is being treated with Magnesium sulfate x 24h, procardia and will remain inpatient until delivery.   We reviewed initial delivery room management, including intubation and surfactant and CPAP.  Discussed mechanical ventilation and risk for chronic lung disease, Discussed NG / OG feeds, benefits of MBM. We discussed feeding immaturity and need for full po intake with multiple days of good weight gain and no apnea or bradycardia before discharge.  We reviewed increased risk of jaundice, infection, and temperature instability.   Discussed likely length of stay.  Thank you for allowing [place] to participate in her care.  Please call with questions.  John Giovanni, DO  Neonatologist  The total length of face-to-face or floor / unit time for this encounter was 35 minutes.  Counseling and / or coordination of care was greater than fifty percent of the time.

## 2023-01-27 LAB — CBC
HCT: 33.8 % — ABNORMAL LOW (ref 36.0–46.0)
HCT: 36.2 % (ref 36.0–46.0)
Hemoglobin: 10.9 g/dL — ABNORMAL LOW (ref 12.0–15.0)
Hemoglobin: 12.1 g/dL (ref 12.0–15.0)
MCH: 26.6 pg (ref 26.0–34.0)
MCH: 27.7 pg (ref 26.0–34.0)
MCHC: 32.2 g/dL (ref 30.0–36.0)
MCHC: 33.4 g/dL (ref 30.0–36.0)
MCV: 82.4 fL (ref 80.0–100.0)
MCV: 82.8 fL (ref 80.0–100.0)
Platelets: 145 10*3/uL — ABNORMAL LOW (ref 150–400)
Platelets: 145 10*3/uL — ABNORMAL LOW (ref 150–400)
RBC: 4.1 MIL/uL (ref 3.87–5.11)
RBC: 4.37 MIL/uL (ref 3.87–5.11)
RDW: 13.2 % (ref 11.5–15.5)
RDW: 13.2 % (ref 11.5–15.5)
WBC: 10.5 10*3/uL (ref 4.0–10.5)
WBC: 13.2 10*3/uL — ABNORMAL HIGH (ref 4.0–10.5)
nRBC: 0 % (ref 0.0–0.2)
nRBC: 0 % (ref 0.0–0.2)

## 2023-01-27 LAB — COMPREHENSIVE METABOLIC PANEL
ALT: 33 U/L (ref 0–44)
AST: 34 U/L (ref 15–41)
Albumin: 2.4 g/dL — ABNORMAL LOW (ref 3.5–5.0)
Alkaline Phosphatase: 82 U/L (ref 38–126)
Anion gap: 5 (ref 5–15)
BUN: 8 mg/dL (ref 6–20)
CO2: 22 mmol/L (ref 22–32)
Calcium: 6.8 mg/dL — ABNORMAL LOW (ref 8.9–10.3)
Chloride: 109 mmol/L (ref 98–111)
Creatinine, Ser: 0.55 mg/dL (ref 0.44–1.00)
GFR, Estimated: >60 mL/min (ref >60)
Glucose, Bld: 112 mg/dL — ABNORMAL HIGH (ref 70–99)
Potassium: 3.8 mmol/L (ref 3.5–5.1)
Sodium: 136 mmol/L (ref 135–145)
Total Bilirubin: 0.3 mg/dL (ref 0.3–1.2)
Total Protein: 5.6 g/dL — ABNORMAL LOW (ref 6.5–8.1)

## 2023-01-27 LAB — GLUCOSE, CAPILLARY
Glucose-Capillary: 117 mg/dL — ABNORMAL HIGH (ref 70–99)
Glucose-Capillary: 110 mg/dL — ABNORMAL HIGH (ref 70–99)
Glucose-Capillary: 114 mg/dL — ABNORMAL HIGH (ref 70–99)
Glucose-Capillary: 105 mg/dL — ABNORMAL HIGH (ref 70–99)
Glucose-Capillary: 96 mg/dL (ref 70–99)
Glucose-Capillary: 101 mg/dL — ABNORMAL HIGH (ref 70–99)
Glucose-Capillary: 87 mg/dL (ref 70–99)

## 2023-01-27 LAB — RPR: RPR Ser Ql: NONREACTIVE

## 2023-01-27 MED ORDER — PENICILLIN G POT IN DEXTROSE 60000 UNIT/ML IV SOLN
3.0000 10*6.[IU] | INTRAVENOUS | Status: DC
  Administered 2023-01-27 – 2023-01-28 (×6): 3 10*6.[IU] via INTRAVENOUS
  Filled 2023-01-27 (×10): qty 50

## 2023-01-27 MED ORDER — INSULIN ASPART 100 UNIT/ML IJ SOLN
0.0000 [IU] | INTRAMUSCULAR | Status: DC
  Administered 2023-01-27 (×3): 1 [IU] via SUBCUTANEOUS

## 2023-01-27 MED ORDER — SODIUM CHLORIDE 0.9 % IV SOLN
5.0000 10*6.[IU] | Freq: Once | INTRAVENOUS | Status: AC
  Administered 2023-01-27: 5 10*6.[IU] via INTRAVENOUS
  Filled 2023-01-27: qty 5

## 2023-01-27 NOTE — Progress Notes (Signed)
LABOR PROGRESS NOTE  Brooke Small is a 22 y.o. G1P0000 at [redacted]w[redacted]d  presented for IOL d/t severe PEC.  She is starting to feel some abdominal cramps intermittently, likely contractions.  Otherwise comfortable, tolerating pitocin well.  Denies headache, vision changes, LUQ pain.  Objective: BP 131/72   Pulse 88   Temp 98.4 F (36.9 C) (Oral)   Resp 18   Ht 5\' 5"  (1.651 m)   Wt 127.8 kg   LMP 06/15/2022 (Exact Date)   SpO2 99%   BMI 46.89 kg/m   Dilation: Closed Effacement (%): 50 Station: Ballotable Presentation: Vertex Exam by:: Letitia Neri, RN *Minimizing cervical check frequency for premature pregnancy  Fetal monitoring: Baseline: 130 bpm, Variability: minimal on magnesium, Accelerations: absent, and Decelerations: Absent Uterine activity: External monitor not reading well, limited by large body habitus  Assessment / Plan: 22 y.o. G1P0000 at [redacted]w[redacted]d here for IOL d/t severe PEC.  Labor: No ROM yet.  Still on pitocin at 10, will likely need to place Foley or further augment labor pending attending discussion. Fetal Wellbeing:  Cat II Pain Control:  Still considering epidural, minimal pain. Anticipated MOD: Vaginal #GBS:  Not done, negative PCR, no culture drawn, now on PCN.  #SPEC: Nifedipine 30 QD, magnesium gtt at 2.  BPs still mild range with a few intermittent moderate range. #Thrombocytopenia: Plts 119>145, CTM. #Hyperglycemia: Likely s/p betamethasone, CBGs still stable on sliding scale.  Adell Koval Sharion Dove, MD

## 2023-01-27 NOTE — Progress Notes (Signed)
Patient ID: Brooke Small, female   DOB: 04-18-2001, 22 y.o.   MRN: [id number] Pt starting to feel some cramps now FHT's 130's min variability, no decels BP and CBG stable Pitocin at 7  Continue with current management

## 2023-01-27 NOTE — Progress Notes (Signed)
Brooke Small is a 22 y.o. G1P0000 at [redacted]w[redacted]d admitted for preeclampsia with severe features.  Subjective: Pt feeling intermittent cramping. Family in room for support.  Objective: BP (!) 141/84   Pulse 87   Temp 98.1 F (36.7 C) (Oral)   Resp 18   Ht 5\' 5"  (1.651 m)   Wt 127.8 kg   LMP 06/15/2022 (Exact Date)   SpO2 99%   BMI 46.89 kg/m  I/O last 3 completed shifts: In: 5351.4 [P.O.:2040; I.V.:3011.4; IV Piggyback:300] Out: 2550 [Urine:2550] No intake/output data recorded.  FHT:  FHR: 130 bpm, variability: moderate with periods of minimal,  accelerations:  Present,  decelerations:  Absent UC:   irregular, every 2-10 minutes SVE:   Dilation: 1 Effacement (%): 40 Station: Ballotable Exam by:: Alethia Berthold RN Foley balloon inserted by CNM.  Filled to 40 ml of NS by RN. Pt tolerated well.   Labs: Lab Results  Component Value Date   WBC 10.5 01/27/2023   HGB 10.9 (L) 01/27/2023   HCT 33.8 (L) 01/27/2023   MCV 82.4 01/27/2023   PLT 145 (L) 01/27/2023    Assessment / Plan: Induction of labor due to preeclampsia with severe features Pitocin low dose, max of 10 mu/min   Labor:  Slow progress but interventions held while other high risk deliveries occurred on L&D and in the OR. Discussed FB insertion and pt agreed with plan of care.  Will recheck in ~ 4 hours or sooner for maternal or fetal indications.  Preeclampsia:  labs stable Fetal Wellbeing:  Category II Pain Control:  Labor support without medications I/D:   GBS unknown Anticipated MOD:  NSVD  Sharen Counter, CNM 01/27/2023, 7:01 PM

## 2023-01-27 NOTE — Inpatient Diabetes Management (Signed)
Inpatient Diabetes Program Recommendations  Diabetes Treatment Program Recommendations  ADA Standards of Care Diabetes in Pregnancy Target Glucose Ranges:  Fasting: 70 - 95 mg/dL 1 hr postprandial: Less than 140mg /dL (from first bite of meal) 2 hr postprandial: Less than 120 mg/dL (from first bite of meal)     Latest Reference Range & Units 01/27/23 02:15 01/27/23 06:19 01/27/23 06:21  Glucose-Capillary 70 - 99 mg/dL 161 (H) 096 (H) 045 (H)    Latest Reference Range & Units 01/26/23 12:03 01/26/23 18:08 01/26/23 21:48  Glucose-Capillary 70 - 99 mg/dL 409 (H) 811 (H) 914 (H)    Latest Reference Range & Units 08/31/22 16:30  Hemoglobin A1C 4.8 - 5.6 % 5.7 (H)   Review of Glycemic Control  Diabetes history: ? PreDM Outpatient Diabetes medications: NA Current orders for Inpatient glycemic control: NPH 10 units BID, Novolog 0-15 units Q4H  Inpatient Diabetes Program Recommendations:    Insulin: Since patient is now on L&D, please discontinue NPH and current Novolog correction orders and use Diabetes Treatment for Pregnant/Postpartum Patients order set to order Novolog 0-14 units Q4H (which starts correction if CBG over 90 mg/dl).  If CBGs are consistently over 120 mg/dl with Novolog correction Q4H, then recommend to discontinue SQ insulin and order IV insulin drip.  Thanks, Orlando Penner, RN, MSN, CDCES Diabetes Coordinator Inpatient Diabetes Program [phone] (Team Pager from 8am to 5pm)

## 2023-01-27 NOTE — Progress Notes (Signed)
Brooke Small is a 22 y.o. G1P0000 at [redacted]w[redacted]d admitted for preeclampsia with severe features.  Subjective: Patient denies any questions or concerns at this time.  Objective: BP 134/82   Pulse 80   Temp 98.2 F (36.8 C) (Oral)   Resp 18   Ht 5\' 5"  (1.651 m)   Wt 127.8 kg   LMP 06/15/2022 (Exact Date)   SpO2 99%   BMI 46.89 kg/m  I/O last 3 completed shifts: In: 5351.4 [P.O.:2040; I.V.:3011.4; IV Piggyback:300] Out: 2550 [Urine:2550] No intake/output data recorded.  FHT:  FHR: 125 bpm, minimal,  accelerations:  absent,  decelerations:  absent UC:   unable to ascertain on toco -- pt reports every 5-10 min SVE:   Dilation: 1 Effacement (%): 40 Station: Ballotable Exam by:: [employer] RN  Labs: Lab Results  Component Value Date   WBC 13.2 (H) 01/27/2023   HGB 12.1 01/27/2023   HCT 36.2 01/27/2023   MCV 82.8 01/27/2023   PLT 145 (L) 01/27/2023    Assessment / Plan: Induction of labor due to preeclampsia with severe features Pitocin low dose, max of 10 mu/min   Labor: s/p FB, cont current dose of pitocin, will cont to reassess progress Preeclampsia:  labs stable, BP ok Fetal Wellbeing:  Category II Pain Control:  Labor support without medications I/D:   GBS unknown Anticipated MOD:  NSVD  Sundra Aland, MD 01/27/2023, 9:40 PM

## 2023-01-27 NOTE — Progress Notes (Addendum)
LABOR PROGRESS NOTE  Brooke Small is a 22 y.o. G1P0000 at [redacted]w[redacted]d presented for IOL d/t severe PEC on magnesium and complicated by IUGR, gestational thrombocytopenia, marginal cord insertion.  Subjective: Patient resting in bed, states she is feeling well.  Not feeling contractions much yet, overall comfortable.  Attempting to ambulate as able.  Objective: BP 126/75   Pulse 80   Temp 98 F (36.7 C) (Oral)   Resp 18   Ht 5\' 5"  (1.651 m)   Wt 127.8 kg   LMP 06/15/2022 (Exact Date)   SpO2 99%   BMI 46.89 kg/m  or  Vitals:   01/27/23 0630 01/27/23 0700 01/27/23 0732 01/27/23 0802  BP: 137/75 138/73 130/81 126/75  Pulse: 86 87 92 80  Resp: 17 16 18    Temp: 98 F (36.7 C)     TempSrc: Oral     SpO2: 99%     Weight:      Height:       Dilation: Closed Effacement (%): 50 Station: Ballotable Presentation: Vertex Exam by:: Letitia Neri, RN Fetal monitoring: Baseline: 110 bpm, Variability: minimal with periods of moderate, Accelerations: not present, on magnesium, and Decelerations: Absent Uterine activity: Every 4-5 minutes, but difficult to track on external monitor due to body habitus, not feeling them much  Labs: Lab Results  Component Value Date   WBC 10.5 01/27/2023   HGB 10.9 (L) 01/27/2023   HCT 33.8 (L) 01/27/2023   MCV 82.4 01/27/2023   PLT 145 (L) 01/27/2023    Patient Active Problem List   Diagnosis Date Noted   Cholelithiasis affecting pregnancy in third trimester, antepartum 01/25/2023   Gestational thrombocytopenia, third trimester (HCC) 01/25/2023   Severe pre-eclampsia 01/25/2023   BMI 45.0-49.9, adult (HCC) 01/23/2023   Fetal growth restriction antepartum 01/16/2023   Marginal insertion of umbilical cord affecting management of mother 11/01/2022   Obesity affecting pregnancy 09/07/2022   Supervision of high-risk pregnancy 08/17/2022    Assessment / Plan: 22 y.o. G1P0000 at [redacted]w[redacted]d here for IOL d/t severe PEC and IUGR.  Labor: Encouraging  ambulation, augmented with pitocin gtt at 10.  Continues on magnesium for BP. Fetal Wellbeing: Cat II Pain Control: Considering epidural, but unsure because pain is currently low Anticipated MOD: Vaginal #GBS:  Not done, negative PCR, no culture drawn, considering Abx   #SPEC: Nifedipine 30 QD, magnesium gtt at 2.  Plts 119>145.  BPs mild range, CTM. #Thrombocytopenia: Plts 145, CTM. #Hyperglycemia: Likely s/p betamethasone, appears to have never had 2 hr GTT so unclear if GDM, CBGs now stable.  Adjusted coverage to pregnant sliding scale and d/c NPH per Diabetes Coordinator recs.  Dimitry Sharion Dove, MD

## 2023-01-28 ENCOUNTER — Inpatient Hospital Stay (HOSPITAL_COMMUNITY): Payer: 59 | Admitting: Anesthesiology

## 2023-01-28 ENCOUNTER — Encounter (HOSPITAL_COMMUNITY): Payer: Self-pay | Admitting: Obstetrics and Gynecology

## 2023-01-28 DIAGNOSIS — O9912 Other diseases of the blood and blood-forming organs and certain disorders involving the immune mechanism complicating childbirth: Secondary | ICD-10-CM

## 2023-01-28 DIAGNOSIS — O99214 Obesity complicating childbirth: Secondary | ICD-10-CM

## 2023-01-28 DIAGNOSIS — O4423 Partial placenta previa NOS or without hemorrhage, third trimester: Secondary | ICD-10-CM

## 2023-01-28 DIAGNOSIS — O365930 Maternal care for other known or suspected poor fetal growth, third trimester, not applicable or unspecified: Secondary | ICD-10-CM

## 2023-01-28 DIAGNOSIS — Z3A32 32 weeks gestation of pregnancy: Secondary | ICD-10-CM

## 2023-01-28 DIAGNOSIS — O9962 Diseases of the digestive system complicating childbirth: Secondary | ICD-10-CM

## 2023-01-28 DIAGNOSIS — O99344 Other mental disorders complicating childbirth: Secondary | ICD-10-CM

## 2023-01-28 DIAGNOSIS — O1414 Severe pre-eclampsia complicating childbirth: Secondary | ICD-10-CM

## 2023-01-28 LAB — COMPREHENSIVE METABOLIC PANEL
ALT: 54 U/L — ABNORMAL HIGH (ref 0–44)
ALT: 65 U/L — ABNORMAL HIGH (ref 0–44)
AST: 65 U/L — ABNORMAL HIGH (ref 15–41)
AST: 73 U/L — ABNORMAL HIGH (ref 15–41)
Albumin: 2.4 g/dL — ABNORMAL LOW (ref 3.5–5.0)
Albumin: 2.3 g/dL — ABNORMAL LOW (ref 3.5–5.0)
Alkaline Phosphatase: 87 U/L (ref 38–126)
Alkaline Phosphatase: 88 U/L (ref 38–126)
Anion gap: 8 (ref 5–15)
Anion gap: 10 (ref 5–15)
BUN: 6 mg/dL (ref 6–20)
BUN: 7 mg/dL (ref 6–20)
CO2: 20 mmol/L — ABNORMAL LOW (ref 22–32)
CO2: 18 mmol/L — ABNORMAL LOW (ref 22–32)
Calcium: 6.1 mg/dL — CL (ref 8.9–10.3)
Calcium: 6.1 mg/dL — CL (ref 8.9–10.3)
Chloride: 104 mmol/L (ref 98–111)
Chloride: 102 mmol/L (ref 98–111)
Creatinine, Ser: 0.54 mg/dL (ref 0.44–1.00)
Creatinine, Ser: 0.55 mg/dL (ref 0.44–1.00)
GFR, Estimated: >60 mL/min (ref >60)
GFR, Estimated: >60 mL/min (ref >60)
Glucose, Bld: 91 mg/dL (ref 70–99)
Glucose, Bld: 91 mg/dL (ref 70–99)
Potassium: 3.6 mmol/L (ref 3.5–5.1)
Potassium: 4.1 mmol/L (ref 3.5–5.1)
Sodium: 132 mmol/L — ABNORMAL LOW (ref 135–145)
Sodium: 130 mmol/L — ABNORMAL LOW (ref 135–145)
Total Bilirubin: 0.6 mg/dL (ref 0.3–1.2)
Total Bilirubin: 1.4 mg/dL — ABNORMAL HIGH (ref 0.3–1.2)
Total Protein: 5.4 g/dL — ABNORMAL LOW (ref 6.5–8.1)
Total Protein: 5.3 g/dL — ABNORMAL LOW (ref 6.5–8.1)

## 2023-01-28 LAB — CBC
HCT: 35.2 % — ABNORMAL LOW (ref 36.0–46.0)
HCT: 37.7 % (ref 36.0–46.0)
Hemoglobin: 11.5 g/dL — ABNORMAL LOW (ref 12.0–15.0)
Hemoglobin: 12.4 g/dL (ref 12.0–15.0)
MCH: 26.8 pg (ref 26.0–34.0)
MCH: 26.8 pg (ref 26.0–34.0)
MCHC: 32.7 g/dL (ref 30.0–36.0)
MCHC: 32.9 g/dL (ref 30.0–36.0)
MCV: 82.1 fL (ref 80.0–100.0)
MCV: 81.4 fL (ref 80.0–100.0)
Platelets: 119 10*3/uL — ABNORMAL LOW (ref 150–400)
Platelets: 91 10*3/uL — ABNORMAL LOW (ref 150–400)
RBC: 4.29 MIL/uL (ref 3.87–5.11)
RBC: 4.63 MIL/uL (ref 3.87–5.11)
RDW: 13.2 % (ref 11.5–15.5)
RDW: 13.1 % (ref 11.5–15.5)
WBC: 11.8 10*3/uL — ABNORMAL HIGH (ref 4.0–10.5)
WBC: 16.2 10*3/uL — ABNORMAL HIGH (ref 4.0–10.5)
nRBC: 0 % (ref 0.0–0.2)
nRBC: 0 % (ref 0.0–0.2)

## 2023-01-28 LAB — GLUCOSE, CAPILLARY
Glucose-Capillary: 83 mg/dL (ref 70–99)
Glucose-Capillary: 79 mg/dL (ref 70–99)
Glucose-Capillary: 89 mg/dL (ref 70–99)

## 2023-01-28 LAB — MAGNESIUM: Magnesium: 6 mg/dL — ABNORMAL HIGH (ref 1.7–2.4)

## 2023-01-28 MED ORDER — ONDANSETRON HCL 4 MG/2ML IJ SOLN: 4.0000 mg | INTRAMUSCULAR | Status: DC | PRN

## 2023-01-28 MED ORDER — PHENYLEPHRINE 80 MCG/ML (10ML) SYRINGE FOR IV PUSH (FOR BLOOD PRESSURE SUPPORT): 80.0000 ug | PREFILLED_SYRINGE | INTRAVENOUS | Status: DC | PRN

## 2023-01-28 MED ORDER — BENZOCAINE-MENTHOL 20-0.5 % EX AERO: 1.0000 | INHALATION_SPRAY | CUTANEOUS | Status: DC | PRN

## 2023-01-28 MED ORDER — SIMETHICONE 80 MG PO CHEW: 80.0000 mg | CHEWABLE_TABLET | ORAL | Status: DC | PRN

## 2023-01-28 MED ORDER — COCONUT OIL OIL
1.0000 | TOPICAL_OIL | Status: DC | PRN
  Administered 2023-01-28: 1 via TOPICAL

## 2023-01-28 MED ORDER — TETANUS-DIPHTH-ACELL PERTUSSIS 5-2.5-18.5 LF-MCG/0.5 IM SUSY: 0.5000 mL | PREFILLED_SYRINGE | Freq: Once | INTRAMUSCULAR | Status: DC

## 2023-01-28 MED ORDER — EPHEDRINE 5 MG/ML INJ: 10.0000 mg | INTRAVENOUS | Status: DC | PRN

## 2023-01-28 MED ORDER — ACETAMINOPHEN 325 MG PO TABS
650.0000 mg | ORAL_TABLET | ORAL | Status: DC | PRN
  Administered 2023-01-28 – 2023-01-30 (×3): 650 mg via ORAL
  Filled 2023-01-28 (×3): qty 2

## 2023-01-28 MED ORDER — FUROSEMIDE 20 MG PO TABS
20.0000 mg | ORAL_TABLET | Freq: Every day | ORAL | Status: DC
  Administered 2023-01-28 – 2023-01-30 (×3): 20 mg via ORAL
  Filled 2023-01-28 (×3): qty 1

## 2023-01-28 MED ORDER — PRENATAL MULTIVITAMIN CH
1.0000 | ORAL_TABLET | Freq: Every day | ORAL | Status: DC
  Administered 2023-01-29 – 2023-01-30 (×2): 1 via ORAL
  Filled 2023-01-28 (×2): qty 1

## 2023-01-28 MED ORDER — SENNOSIDES-DOCUSATE SODIUM 8.6-50 MG PO TABS
2.0000 | ORAL_TABLET | Freq: Every day | ORAL | Status: DC
  Administered 2023-01-29 – 2023-01-30 (×2): 2 via ORAL
  Filled 2023-01-28 (×2): qty 2

## 2023-01-28 MED ORDER — DIBUCAINE (PERIANAL) 1 % EX OINT: 1.0000 | TOPICAL_OINTMENT | CUTANEOUS | Status: DC | PRN

## 2023-01-28 MED ORDER — ZOLPIDEM TARTRATE 5 MG PO TABS: 5.0000 mg | ORAL_TABLET | Freq: Every evening | ORAL | Status: DC | PRN

## 2023-01-28 MED ORDER — DIPHENHYDRAMINE HCL 50 MG/ML IJ SOLN: 12.5000 mg | INTRAMUSCULAR | Status: DC | PRN

## 2023-01-28 MED ORDER — FENTANYL-BUPIVACAINE-NACL 0.5-0.125-0.9 MG/250ML-% EP SOLN
12.0000 mL/h | EPIDURAL | Status: DC | PRN
  Administered 2023-01-28: 12 mL/h via EPIDURAL
  Filled 2023-01-28 (×2): qty 250

## 2023-01-28 MED ORDER — LACTATED RINGERS IV SOLN
500.0000 mL | Freq: Once | INTRAVENOUS | Status: AC
  Administered 2023-01-28: 500 mL via INTRAVENOUS

## 2023-01-28 MED ORDER — WITCH HAZEL-GLYCERIN EX PADS: 1.0000 | MEDICATED_PAD | CUTANEOUS | Status: DC | PRN

## 2023-01-28 MED ORDER — LIDOCAINE HCL (PF) 1 % IJ SOLN
INTRAMUSCULAR | Status: DC | PRN
  Administered 2023-01-28 (×2): 4 mL via EPIDURAL

## 2023-01-28 MED ORDER — ONDANSETRON HCL 4 MG PO TABS: 4.0000 mg | ORAL_TABLET | ORAL | Status: DC | PRN

## 2023-01-28 MED ORDER — DIPHENHYDRAMINE HCL 25 MG PO CAPS: 25.0000 mg | ORAL_CAPSULE | Freq: Four times a day (QID) | ORAL | Status: DC | PRN

## 2023-01-28 MED ORDER — IBUPROFEN 600 MG PO TABS
600.0000 mg | ORAL_TABLET | Freq: Four times a day (QID) | ORAL | Status: DC
  Administered 2023-01-29 (×2): 600 mg via ORAL
  Filled 2023-01-28 (×2): qty 1

## 2023-01-28 NOTE — Lactation Note (Signed)
This note was copied from a baby's chart.  NICU Lactation Consultation Note  Patient Name: Brooke Small ZOXWR'U Date: 01/28/2023 Age:20 hours  Reason for consult: Initial assessment; Primapara; 1st time breastfeeding; NICU baby; Preterm <34wks; Other (Comment); Infant < 6lbs ([employer] employee, Pre-E)  SUBJECTIVE Visited with family of 3 hours old pre-term NICU female; Brooke Small is a P1 and reports that her plan is to do direct breastfeeding along with pumping and bottle feeding once baby is ready. She just came from L&D to her room on the first floor and initiated pumping during [facility] consult, praised her for her efforts. She's a [employer] employee, she has a DEBP home but didn't get it through [employer], her insurance company. Assisted with hand expression and provided a pumping band in size XL for hands on pumping. Reviewed pumping schedule, pump settings, lactogenesis II and anticipatory guidelines.   OBJECTIVE Infant data: Mother's Current Feeding Choice: -- (NPO)  O2 Device: CPAP FiO2 (%): 28 %  Maternal data: G1P0101 Vaginal, Spontaneous Has patient been taught Hand Expression?: Yes Hand Expression Comments: no colostrum noted yet Significant Breast History:: (+) breast changes during the pregnancy Current breast feeding challenges:: NICU admission Does the patient have breastfeeding experience prior to this delivery?: No Pumping frequency: initiated pumping at 3 hours post-partum Pumped volume: 0 mL Flange Size: 24 Hands-free pumping top sizes: X-Large Chilton Si) Risk factor for low/delayed milk supply:: primipara, prematurity, infant separation, Pre-E, Mag  Pump: Personal (Medela DEBP at home)  ASSESSMENT Infant: Feeding Status: NPO  Maternal: Milk volume: Normal  INTERVENTIONS/PLAN Interventions: Interventions: Breast feeding basics reviewed; Breast massage; Hand express; Coconut oil; DEBP; Education; [employer] brochure Tools: Pump; Flanges; Coconut oil; Hands-free pumping  top Pump Education: Setup, frequency, and cleaning; Milk Storage  Plan: Encouraged pumping every 3 hours, ideally 8 pumping sessions/24 hours Breast massage, hand expression and coconut oil were also encouraged prior pumping She'll let lactation know once she's ready to pick up her employee pump  MGM, PGM and female visitor present. All questions and concerns answered, family to contact [facility] services PRN.  Consult Status: NICU follow-up NICU Follow-up type: New admission follow up   Brooke Small Brooke Small 01/28/2023, 6:44 PM

## 2023-01-28 NOTE — Anesthesia Procedure Notes (Signed)
Epidural Patient location during procedure: OB Start time: 01/28/2023 12:28 AM End time: 01/28/2023 12:45 AM  Staffing Anesthesiologist: Kaylyn Layer, MD Performed: anesthesiologist   Preanesthetic Checklist Completed: patient identified, IV checked, risks and benefits discussed, monitors and equipment checked, pre-op evaluation and timeout performed  Epidural Patient position: sitting Prep: DuraPrep and site prepped and draped Patient monitoring: continuous pulse ox, blood pressure and heart rate Approach: midline Location: L3-L4 Injection technique: LOR air  Needle:  Needle type: Tuohy  Needle gauge: 17 G Needle length: 9 cm Needle insertion depth: 8 cm Catheter type: closed end flexible Catheter size: 19 Gauge Catheter at skin depth: 13 cm Test dose: negative and Other (1% lidocaine)  Assessment Events: blood not aspirated, no cerebrospinal fluid, injection not painful, no injection resistance, no paresthesia and negative IV test  Additional Notes Patient identified. Risks, benefits, and alternatives discussed with patient including but not limited to bleeding, infection, nerve damage, paralysis, failed block, incomplete pain control, headache, blood pressure changes, nausea, vomiting, reactions to medication, itching, and postpartum back pain. Confirmed with bedside nurse the patient's most recent platelet count. Confirmed with patient that they are not currently taking any anticoagulation, have any bleeding history, or any family history of bleeding disorders. Patient expressed understanding and wished to proceed. All questions were answered. Sterile technique was used throughout the entire procedure. Please see nursing notes for vital signs.   Very difficult placement due to habitus. 4 attempts required. Test dose was given through epidural catheter and negative prior to continuing to dose epidural or start infusion. Warning signs of high block given to the patient  including shortness of breath, tingling/numbness in hands, complete motor block, or any concerning symptoms with instructions to call for help. Patient was given instructions on fall risk and not to get out of bed. All questions and concerns addressed with instructions to call with any issues or inadequate analgesia.  Reason for block:procedure for pain

## 2023-01-28 NOTE — Progress Notes (Signed)
Brooke Small is a 22 y.o. G1P0000 at [redacted]w[redacted]d admitted for preeclampsia with severe features.  Subjective: Called to bedside followingt expulsion of FB. Pt reports ongoing slightly more uncomfortably ctx.  Objective: BP (!) 146/83   Pulse 86   Temp 97.9 F (36.6 C) (Oral)   Resp 17   Ht 5\' 5"  (1.651 m)   Wt 127.8 kg   LMP 06/15/2022 (Exact Date)   SpO2 99%   BMI 46.89 kg/m  I/O last 3 completed shifts: In: 5440.2 [P.O.:2040; I.V.:3100.2; IV Piggyback:300] Out: 2550 [Urine:2550] Total I/O In: 599.9 [I.V.:549.9; IV Piggyback:50] Out: 250 [Urine:250]  FHT:  130/min/-a/-d SVE:   Dilation: 2 Effacement (%): 40 Station: -3 Exam by: Sundra Aland, MD  Labs: Lab Results  Component Value Date   WBC 13.2 (H) 01/27/2023   HGB 12.1 01/27/2023   HCT 36.2 01/27/2023   MCV 82.8 01/27/2023   PLT 145 (L) 01/27/2023    Assessment / Plan: Induction of labor due to preeclampsia with severe features  #Labor: FB out, pt verbally consented to AROM  AROM performed with clear fluid  IUPC placed bec unable to pick up ctx on toco  will cont to uptitrate pit by 1 #FWB: Cat II strip #Pain control: Given one dose IV Fentanyl at time of AROM/IUPC placement, desires epidural #ID: GBS neg, on PCN (GBS PCR neg, but at increased risk given GA, so will continue   #Preeclampsia: Some mild range Bps, but nothing requiring tx at this time #A2GDM: BG wnl  cont CBG q4h #Gestational thrombocytopenia   Sundra Aland, MD 01/28/2023, 12:17 AM

## 2023-01-28 NOTE — Discharge Summary (Signed)
Postpartum Discharge Summary  Date of Service updated***     Patient Name: Brooke Small DOB: 04-29-2000 MRN: [id number]  Date of admission: 01/25/2023 Delivery date:01/28/2023 Delivering provider:   Date of discharge: 01/28/2023  Admitting diagnosis: Severe pre-eclampsia [O14.10] Intrauterine pregnancy: [redacted]w[redacted]d     Secondary diagnosis:  Principal Problem:   Severe pre-eclampsia Active Problems:   Gestational thrombocytopenia, third trimester (HCC)  Additional problems: ***    Discharge diagnosis: Preterm Pregnancy Delivered and Preeclampsia (severe)                                              Post partum procedures:{Postpartum procedures:23558} Augmentation: AROM, Pitocin, Cytotec, and IP Foley Complications: None  Hospital course: Induction of Labor With Vaginal Delivery   22 y.o. yo G1P0000 at [redacted]w[redacted]d was admitted to the hospital 01/25/2023 for induction of labor.  Indication for induction: Preeclampsia.  Patient had an labor course complicated by severe preeclampsia requiring magnesium. Membrane Rupture Time/Date: 12:00 AM,01/28/2023  Delivery Method:Vaginal, Spontaneous Operative Delivery:N/A Episiotomy: None Lacerations:  1st degree;Perineal Details of delivery can be found in separate delivery note.  Patient had a postpartum course complicated by***. Patient is discharged home 01/28/23.  Newborn Data: Birth date:01/28/2023 Birth time:3:04 PM Gender:Female Living status:  Apgars: ,  Weight:1280 g  Magnesium Sulfate received: Yes: Seizure prophylaxis BMZ received: Yes Rhophylac:N/A NWG:{NFA:21308657} T-DaP:{Tdap:23962} Flu: {QIO:96295} RSV Vaccine received: {RSV:31013} Transfusion:{Transfusion received:30440034}  Immunizations received: Immunization History  Administered Date(s) Administered   Influenza, Seasonal, Injecte, Preservative Fre 01/23/2023   Influenza-Unspecified 02/05/2022   Pfizer Covid-19 Vaccine Bivalent Booster 12yrs & up 06/03/2021   Tdap  01/23/2023    Physical exam  Vitals:   01/28/23 1302 01/28/23 1332 01/28/23 1402 01/28/23 1432  BP: (!) 140/78 (!) 147/87 139/85 137/85  Pulse: 79 83 85 88  Resp:  18  20  Temp:  97.9 F (36.6 C)    TempSrc:  Oral    SpO2:      Weight:      Height:       General: {Exam; general:21111117} Lochia: {Desc; appropriate/inappropriate:30686::"appropriate"} Uterine Fundus: {Desc; firm/soft:30687} Incision: {Exam; incision:21111123} DVT Evaluation: {Exam; dvt:2111122} Labs: Lab Results  Component Value Date   WBC 11.8 (H) 01/28/2023   HGB 11.5 (L) 01/28/2023   HCT 35.2 (L) 01/28/2023   MCV 82.1 01/28/2023   PLT 119 (L) 01/28/2023      Latest Ref Rng & Units 01/28/2023    5:19 AM  CMP  Glucose 70 - 99 mg/dL 91   BUN 6 - 20 mg/dL 6   Creatinine 2.84 - 1.32 mg/dL 4.40   Sodium 102 - 725 mmol/L 132   Potassium 3.5 - 5.1 mmol/L 3.6   Chloride 98 - 111 mmol/L 104   CO2 22 - 32 mmol/L 20   Calcium 8.9 - 10.3 mg/dL 6.1   Total Protein 6.5 - 8.1 g/dL 5.4   Total Bilirubin 0.3 - 1.2 mg/dL 0.6   Alkaline Phos 38 - 126 U/L 87   AST 15 - 41 U/L 65   ALT 0 - 44 U/L 54    Edinburgh Score:    08/17/2022    1:49 PM  Edinburgh Postnatal Depression Scale Screening Tool  I have been able to laugh and see the funny side of things. 0  I have looked forward with enjoyment to things. 0  I have  blamed myself unnecessarily when things went wrong. 0  I have been anxious or worried for no good reason. 2  I have felt scared or panicky for no good reason. 0  Things have been getting on top of me. 1  I have been so unhappy that I have had difficulty sleeping. 0  I have felt sad or miserable. 0  I have been so unhappy that I have been crying. 0  The thought of harming myself has occurred to me. 0  Edinburgh Postnatal Depression Scale Total 3   No data recorded  After visit meds:  Allergies as of 01/28/2023   No Known Allergies   Med Rec must be completed prior to using this  [facility]***        Discharge home in stable condition Infant Feeding: {Baby feeding:23562} Infant Disposition:{CHL IP OB HOME WITH WUJWJX:91478} Discharge instruction: per After Visit Summary and Postpartum booklet. Activity: Advance as tolerated. Pelvic rest for 6 weeks.  Diet: {OB GNFA:21308657} Future Appointments: Future Appointments  Date Time Provider Department Center  01/31/2023  1:30 PM [facility] US4 [facility]  02/06/2023  1:00 PM [facility] NURSE [facility]  02/06/2023  1:15 PM [facility] NST [facility]  02/06/2023  2:30 PM [facility] US5 [facility]  02/07/2023  1:30 PM [place] Bing, MD [facility]  02/13/2023  1:00 PM [facility] NURSE [facility]  02/13/2023  1:15 PM [facility] NST [facility]  02/13/2023  2:30 PM [facility] US3 [facility]  02/22/2023  8:35 AM [place] Bing, MD [facility]   Follow up Visit:   Please schedule this patient for a In person postpartum visit in 4 weeks with the following provider: Any provider. Additional Postpartum F/U:BP check 1 week  High risk pregnancy complicated by: HTN Delivery mode:  Vaginal, Spontaneous Anticipated Birth Control:  {Birth Control:23956}   01/28/2023 Celedonio Savage, MD

## 2023-01-28 NOTE — Progress Notes (Signed)
Brooke Small is a 22 y.o. G1P0000 at [redacted]w[redacted]d admitted for preeclampsia with severe features.  Subjective: Patient reports feeling the pressure of contractions while awake, but has been able to rest more since her epidural.   Objective: BP 136/86   Pulse 70   Temp 97.8 F (36.6 C) (Oral)   Resp 15   Ht 5\' 5"  (1.651 m)   Wt 127.8 kg   LMP 06/15/2022 (Exact Date)   SpO2 98%   BMI 46.89 kg/m  I/O last 3 completed shifts: In: 5440.2 [P.O.:2040; I.V.:3100.2; IV Piggyback:300] Out: 2550 [Urine:2550] Total I/O In: 1705.4 [I.V.:1552.4; Other:53; IV Piggyback:100] Out: 650 [Urine:650]  FHT:  130/min/-a/-d Dilation: 4 Effacement (%): 40 Cervical Position: Middle Station: -2 Presentation: Vertex Exam by:: Lucianne Muss MD   Labs: Lab Results  Component Value Date   WBC 11.8 (H) 01/28/2023   HGB 11.5 (L) 01/28/2023   HCT 35.2 (L) 01/28/2023   MCV 82.1 01/28/2023   PLT 119 (L) 01/28/2023    Assessment / Plan: Induction of labor due to preeclampsia with severe features  #Labor: IUPC in, ctx are adequate (MVU 220-280 in past two hours), continue current management #FWB: Cat II strip #Pain control: Given one dose IV Fentanyl at time of AROM/IUPC placement, desires epidural #ID: GBS neg, on PCN (GBS PCR neg, but at increased risk given GA, so will continue   #Preeclampsia: Some mild range Bps, but nothing requiring tx at this time #A2GDM: BG wnl  cont CBG q4h #Hypocalcemia: Lab reported critical value of Ca++ 6.1 (corrected is low at 7.4) -- will check ionized Ca and magnesium #Gestational thrombocytopenia   Sundra Aland, MD 01/28/2023, 6:37 AM

## 2023-01-28 NOTE — Progress Notes (Signed)
LABOR PROGRESS NOTE  Brooke Small is a 22 y.o. G1P0000 at [redacted]w[redacted]d  presented for IOL d/t Pre-E w/ severe features.  Subjective: Feeling tired, rather sleepy.  Feeling painful contractions.  Family present for support.  Objective: BP (!) 142/80   Pulse 88   Temp 98 F (36.7 C) (Oral)   Resp 18   Ht 5\' 5"  (1.651 m)   Wt 127.8 kg   LMP 06/15/2022 (Exact Date)   SpO2 98%   BMI 46.89 kg/m  or  Vitals:   01/28/23 1002 01/28/23 1032 01/28/23 1102 01/28/23 1132  BP: 135/80 139/82 136/79 (!) 142/80  Pulse: 77 78 81 88  Resp:  16  18  Temp:  98 F (36.7 C)  98 F (36.7 C)  TempSrc:  Oral  Oral  SpO2:      Weight:      Height:        Dilation: 4 Effacement (%): 40 Cervical Position: Middle Station: -2 Presentation: Vertex Exam by:: Brooke Muss MD Fetal monitoring: Baseline: 140 bpm, Variability: minimal on mag, Accelerations: none on mag, and Decelerations: Variable: mild Uterine activity: 3-4 minutes apart, >200 MVUs  #IUPC replaced by MD, patient tolerated well.  Labs: Lab Results  Component Value Date   WBC 11.8 (H) 01/28/2023   HGB 11.5 (L) 01/28/2023   HCT 35.2 (L) 01/28/2023   MCV 82.1 01/28/2023   PLT 119 (L) 01/28/2023    Patient Active Problem List   Diagnosis Date Noted   Cholelithiasis affecting pregnancy in third trimester, antepartum 01/25/2023   Gestational thrombocytopenia, third trimester (HCC) 01/25/2023   Severe pre-eclampsia 01/25/2023   BMI 45.0-49.9, adult (HCC) 01/23/2023   Fetal growth restriction antepartum 01/16/2023   Marginal insertion of umbilical cord affecting management of mother 11/01/2022   Obesity affecting pregnancy 09/07/2022   Supervision of high-risk pregnancy 08/17/2022    Assessment / Plan: 22 y.o. G1P0000 at [redacted]w[redacted]d here for IOL d/t Pre-E w/ severe features.  Labor: Pitocin up to 19, IUPC in.  Contractions adequate, continuing current management. Fetal Wellbeing:  Cat II Pain Control: Epidural placed Anticipated MOD:  Vaginal #GBS negative by PCR, but <37w increased risk; on empiric PCN  #SPEC: Nifedipine 30 QD, magnesium gtt at 2.  BPs still mild range with some intermittent moderate range. #Thrombocytopenia: 145>119, CTM. #Hypocalcemia: Corrected Ca 7.4 in AM, ionized Ca still pending.  May need more calcium carbonate when ionized results.  Jayshon Dommer Sharion Dove, MD

## 2023-01-28 NOTE — Anesthesia Preprocedure Evaluation (Signed)
Anesthesia Evaluation  Patient identified by MRN, date of birth, ID band Patient awake    Reviewed: Allergy & Precautions, Patient's Chart, lab work & pertinent test results  History of Anesthesia Complications Negative for: history of anesthetic complications  Airway Mallampati: II  TM Distance: >3 FB Neck ROM: Full    Dental no notable dental hx.    Pulmonary asthma    Pulmonary exam normal        Cardiovascular hypertension, Normal cardiovascular exam     Neuro/Psych   Anxiety        GI/Hepatic negative GI ROS, Neg liver ROS,,,  Endo/Other    Morbid obesity  Renal/GU negative Renal ROS     Musculoskeletal negative musculoskeletal ROS (+)    Abdominal   Peds  Hematology negative hematology ROS (+)   Anesthesia Other Findings Day of surgery medications reviewed with patient.  Reproductive/Obstetrics (+) Pregnancy (severe preE on Mg)                             Anesthesia Physical Anesthesia Plan  ASA: 3  Anesthesia Plan: Epidural   Post-op Pain Management:    Induction:   PONV Risk Score and Plan: Treatment may vary due to age or medical condition  Airway Management Planned: Natural Airway  Additional Equipment: Fetal Monitoring  Intra-op Plan:   Post-operative Plan:   Informed Consent: I have reviewed the patients History and Physical, chart, labs and discussed the procedure including the risks, benefits and alternatives for the proposed anesthesia with the patient or authorized representative who has indicated his/her understanding and acceptance.       Plan Discussed with:   Anesthesia Plan Comments:        Anesthesia Quick Evaluation

## 2023-01-29 LAB — CALCIUM, IONIZED: Calcium, Ionized, Serum: 3.8 mg/dL — ABNORMAL LOW (ref 4.5–5.6)

## 2023-01-29 MED ORDER — LACTATED RINGERS IV SOLN: INTRAVENOUS | Status: DC

## 2023-01-29 NOTE — Anesthesia Postprocedure Evaluation (Signed)
Anesthesia Post Note  Patient: Brooke Small  Procedure(s) Performed: AN AD HOC LABOR EPIDURAL     Patient location during evaluation: Mother Baby Anesthesia Type: Epidural Level of consciousness: awake Pain management: satisfactory to patient Vital Signs Assessment: post-procedure vital signs reviewed and stable Respiratory status: spontaneous breathing Cardiovascular status: stable Anesthetic complications: no  No notable events documented.  Last Vitals:  Vitals:   01/29/23 0801 01/29/23 0806  BP: 135/85   Pulse: 79   Resp: 16 18  Temp: 36.7 C   SpO2: 100%     Last Pain:  Vitals:   01/29/23 0806  TempSrc:   PainSc: 0-No pain   Pain Goal: Patients Stated Pain Goal: 0 (01/27/23 0200)                 Cephus Shelling

## 2023-01-29 NOTE — Plan of Care (Signed)
  Problem: Education: Goal: Knowledge of the prescribed therapeutic regimen will improve Outcome: Progressing   Problem: Clinical Measurements: Goal: Complications related to the disease process, condition or treatment will be avoided or minimized Outcome: Progressing   Problem: Education: Goal: Knowledge of the prescribed therapeutic regimen will improve Outcome: Progressing

## 2023-01-29 NOTE — Progress Notes (Signed)
Post Partum Day 1 Subjective: Patient reports feeling well. She is ambulating, voiding and tolerating oral intake. She denies headache, visual changes, RUQ pain. She reports that her daughter is doing well in the NICU  Objective: Blood pressure 135/85, pulse 79, temperature 98.1 F (36.7 C), temperature source Oral, resp. rate 18, height 5\' 5"  (1.651 m), weight 127.8 kg, last menstrual period 06/15/2022, SpO2 100%, unknown if currently breastfeeding.  Physical Exam:  General: alert, cooperative, and no distress Lochia: appropriate Uterine Fundus: firm Incision: n/a DVT Evaluation: No evidence of DVT seen on physical exam.  Recent Labs    01/28/23 0519 01/28/23 1709  HGB 11.5* 12.4  HCT 35.2* 37.7    Assessment/Plan: 22 yo PPD#1 s/p SVD with severe preeclampsia - Continue magnesium sulfate for 24 hours post delivery for seizure prophylaxis - BP stable on procardia - Continue close monitoring - Routine postpartum care   LOS: 4 days   Catalina Antigua, MD 01/29/2023, 8:55 AM

## 2023-01-29 NOTE — Lactation Note (Signed)
This note was copied from a baby's chart.  NICU Lactation Consultation Note  Patient Name: Brooke Small ZOXWR'U Date: 01/29/2023 Age:20 hours  Reason for consult: Follow-up assessment; NICU baby; Primapara; 1st time breastfeeding; Infant < 6lbs; Preterm <34wks; Other (Comment) (GHTN on MgSO4)  SUBJECTIVE  LC in to visit with P1 Mom of 32 wk infant Brooke "Brooke Small" in the NICU.   Mom states she has been pumping every 3 hrs and last pumping, she expressed a drop of colostrum.  Mom aware of importance of consistent pumping with a goal of 8 times per 24 hrs to establish a full milk supply.  Mom educated to disassemble the yellow and white valve piece when washing and drying.  No questions at this point. Mom does have a DEBP at home, received from a family member.  Mom to receive a Medela Freestyle if unable to order a Spectra through [email or web address]   OBJECTIVE Infant data: Mother's Current Feeding Choice: -- (NPO)  O2 Device: CPAP FiO2 (%): 26 %  Infant feeding assessment No data recorded  Maternal data: G1P0101 Vaginal, Spontaneous Has patient been taught Hand Expression?: Yes Hand Expression Comments: no colostrum noted yet Significant Breast History:: (+) breast changes during the pregnancy Current breast feeding challenges:: NICU admission Does the patient have breastfeeding experience prior to this delivery?: No Pumping frequency: Every 3 hrs Pumped volume: 0 mL (drop) Flange Size: 24 Hands-free pumping top sizes: X-Large Chilton Si) Risk factor for low/delayed milk supply:: primipara, prematurity, infant separation, Pre-E, Mag  Pump: Advised to [employer], DEBP, Personal (has a Medela DEBP at home.  Will go to [email or web address] to investigate if another pump could be received, otherwise she would like a Freestyle Medela through insurance)   Feeding Status: NPO  Maternal: Milk volume: Normal  INTERVENTIONS/PLAN Interventions: Interventions: Breast  feeding basics reviewed; Skin to skin; Breast massage; Hand express; DEBP; Education Tools: Pump; Flanges Pump Education: Setup, frequency, and cleaning; Milk Storage  Plan: Consult Status: NICU follow-up NICU Follow-up type: New admission follow up; Verify onset of copious milk; Verify absence of engorgement   Judee Clara 01/29/2023, 10:39 AM

## 2023-01-30 ENCOUNTER — Other Ambulatory Visit (HOSPITAL_COMMUNITY): Payer: Self-pay

## 2023-01-30 MED ORDER — NIFEDIPINE ER OSMOTIC RELEASE 60 MG PO TB24
60.0000 mg | ORAL_TABLET | Freq: Every day | ORAL | Status: DC
  Administered 2023-01-30: 60 mg via ORAL
  Filled 2023-01-30: qty 1

## 2023-01-30 MED ORDER — FUROSEMIDE 20 MG PO TABS
20.0000 mg | ORAL_TABLET | Freq: Every day | ORAL | 0 refills | Status: AC
  Filled 2023-01-30: qty 5, 5d supply, fill #0

## 2023-01-30 MED ORDER — IBUPROFEN 600 MG PO TABS
600.0000 mg | ORAL_TABLET | Freq: Four times a day (QID) | ORAL | 0 refills | Status: AC
  Filled 2023-01-30: qty 30, 8d supply, fill #0

## 2023-01-30 MED ORDER — NIFEDIPINE ER 60 MG PO TB24
60.0000 mg | ORAL_TABLET | Freq: Every day | ORAL | 1 refills | Status: AC
  Filled 2023-01-30: qty 30, 30d supply, fill #0

## 2023-01-30 NOTE — Lactation Note (Signed)
This note was copied from a baby's chart.  NICU Lactation Consultation Note  Patient Name: Brooke Small ZOXWR'U Date: 01/30/2023 Age:20 hours  Reason for consult: Follow-up assessment; NICU baby; Primapara; 1st time breastfeeding; Preterm <34wks; Infant < 6lbs; Maternal discharge  SUBJECTIVE  LC in to visit with P1 Mom of baby "Brooke Small" in the NICU.  Mom has been consistently pumping and last expression yielded 15 ml of milk that she is saving and taking to NICU for baby.  Mom is currently packing up and plans to spend tonight at home before returning tomorrow.  Mom has a Medela PIS DEBP at home from a family member.  Mom choosing to get her employee pump as a Medela Freestyle.  Mom doesn't have her insurance card with her, but plans to have it tomorrow for [facility] to provide her with the employee pump.  LC noted that pump parts were in a basin soaking.  Education on the importance of limiting soaking of pump parts to 5 mins to avoid bacteria overgrowth.  Mom also reminded to disassemble all the pump parts when washing and setting parts in bin for drying.  Engorgement prevention and treatment reviewed. Mom aware of lactation support available to her and encouraged her to ask for help prn.  OBJECTIVE Infant data: Mother's Current Feeding Choice: Breast Milk and Donor Milk  O2 Device: CPAP FiO2 (%): 35 %  Infant feeding assessment No data recorded  Maternal data: G1P0101 Vaginal, Spontaneous Pumping frequency: 8 times per 24 hrs Pumped volume: 15 mL Flange Size: 24  Pump: DEBP, Personal (To bring her insurance card in tomorrow for [facility] to provide her a Medela Freestyle pump as her employee pump)   Feeding Status: Scheduled 8-11-2-5 Feeding method: Tube/Gavage (Bolus)  Maternal: Milk volume: Normal  INTERVENTIONS/PLAN Interventions: Interventions: Breast feeding basics reviewed; Skin to skin; Breast massage; Hand express; DEBP; Education Discharge Education: Engorgement  and breast care Tools: Pump; Flanges Pump Education: Setup, frequency, and cleaning; Milk Storage  Plan: Consult Status: NICU follow-up NICU Follow-up type: Verify onset of copious milk; Verify absence of engorgement; Verify DEBP issuance   Brooke Small 01/30/2023, 10:57 AM

## 2023-01-30 NOTE — Clinical Social Work Maternal (Signed)
CLINICAL SOCIAL WORK MATERNAL/CHILD NOTE  Patient Details  Name: Brooke Small MRN: [id number] Date of Birth: Sep 01, 2000  Date:  01/30/2023  Clinical Social Worker Initiating Note:  Celso Sick[place], [place] Date/Time: Initiated:  01/30/23/1158     Child's Name:  Brooke Small   Biological Parents:  Mother   Need for Interpreter:  None   Reason for Referral:  Parental Support of Premature Babies < 32 weeks/or Critically Ill babies, Behavioral Health Concerns Inocente Salles Score 11)   Address:  [address] [place] [address]    Phone number:  [phone] (home)     Additional phone number:   Household Members/Support Persons (HM/SP):   Household Member/Support Person 1, Household Member/Support Person 2, Household Member/Support Person 3   HM/SP Name Relationship DOB or Age  HM/SP -1   dad    HM/SP -2   stepmom    HM/SP -3   4 siblings    HM/SP -4        HM/SP -5        HM/SP -6        HM/SP -7        HM/SP -8          Natural Supports (not living in the home):  Parent, Immediate Family, Extended Family   Professional Supports: None   Employment: Full-time   Type of Work: Psychologist, sport and exercise   Education:  [employer] school graduate   Homebound arranged:    Surveyor, quantity Resources:  Media planner    Other Resources:      Cultural/Religious Considerations Which May Impact Care:    Strengths:  Ability to meet basic needs  , Home prepared for child  , Understanding of illness   Psychotropic Medications:         Pediatrician:       Pediatrician List:   Radiographer, therapeutic    [place]      Pediatrician Fax Number:    Risk Factors/Current Problems:  Mental Health Concerns     Cognitive State:  Able to Concentrate  , Alert  , Linear Thinking  , Goal Oriented     Mood/Affect:  Calm  , Comfortable  , Interested  , Relaxed     CSW Assessment: CSW met with Brooke Small at infant's bedside  to complete psychosocial assessment, Brooke Small's mother present. CSW introduced self and explained role. Brooke Small granted CSW verbal permission to speak in front of her mother about anything. Brooke Small was welcoming, polite, and remained engaged during assessment. Brooke Small reported that she resides with her dad, step mom, and 4 siblings. Brooke Small reported having items needed to care for infant including a car seat, bassinet, and crib. CSW inquired about Brooke Small's support system, Brooke Small reported that her parents and family are supports.   CSW inquired about Brooke Small's mental health history. Brooke Small reported that she was diagnosed with anxiety 5 years ago. Brooke Small endorsed normal anxiety related to infant's NICU admission and early delivery. Brooke Small reported that she is not currently taking any medication nor participating in therapy to treat anxiety. Brooke Small denied needing any mental health resources. CSW and Brooke Small discussed edinburgh score 11. Brooke Small attributed elevated edinburgh score to recent loss of grandmother right before infant's delivery. CSW offered condolences. CSW and Brooke Small discussed the importance of Brooke Small caring for herself during this time as she is experiencing multiple stressors. CSW inquired about how Brooke Small was feeling  emotionally since giving birth, Brooke Small reported that she was feeling overwhelmed with everything that has happened. CSW acknowledged, normalized, and validated Brooke Small's feelings. CSW reemphasized the importance of Brooke Small caring for herself, Brooke Small verbalized understanding. Brooke Small presented calm and did not demonstrate any acute mental health signs/symptoms. CSW assessed for safety, Brooke Small denied SI, HI, and domestic violence.   CSW provided education regarding the baby blues period vs. perinatal mood disorders, discussed treatment and gave resources for mental health follow up if concerns arise.  CSW recommends self-evaluation during the postpartum time period using the New Mom Checklist from Postpartum Progress and encouraged Brooke Small to contact a medical  professional if symptoms are noted at any time.    CSW provided review of Sudden Infant Death Syndrome (SIDS) precautions.   CSW and Brooke Small discussed infant's NICU admission. CSW informed Brooke Small about the NICU, what to expect, and supports available while infant is admitted to the NICU. Brooke Small reported that she feels well informed about infant's care and denied any transportation barriers with visiting infant in the NICU. Brooke Small denied any questions/concerns regarding the NICU.    CSW will continue to offer resources/supports while infant is admitted to the NICU as Brooke Small opted for CSW to check in weekly.    CSW Plan/Description:  Sudden Infant Death Syndrome (SIDS) Education, Perinatal Mood and Anxiety Disorder (PMADs) Education, Psychosocial Support and Ongoing Assessment of Needs, Other Patient/Family Education    Antionette Poles, LCSW 01/30/2023, 12:00 PM

## 2023-01-31 ENCOUNTER — Ambulatory Visit: Payer: 59

## 2023-01-31 LAB — SURGICAL PATHOLOGY

## 2023-02-01 ENCOUNTER — Other Ambulatory Visit (HOSPITAL_COMMUNITY): Payer: Self-pay

## 2023-02-02 ENCOUNTER — Ambulatory Visit (HOSPITAL_COMMUNITY): Payer: Self-pay

## 2023-02-02 NOTE — Lactation Note (Signed)
This note was copied from a baby's chart.  NICU Lactation Consultation Note  Patient Name: Brooke Small [id number] Date: 02/02/2023 Age:20 days  Reason for consult: Follow-up assessment; NICU baby; Preterm <34wks; Mother's request; RN request; Primapara; 1st time breastfeeding; Infant < 6lbs; Other (Comment) ([employer] employee)  SUBJECTIVE Visited with family of 80 days old pre-term NICU female; Ms. Stevick is a P1 and reports she's been pumping consistently, her milk is in, praised her for her efforts. NICU RN Mordecai Maes inquired about a breast pump; Ms. Lafata has decided on the [facility], issued employee pump and provided additional storage bottles per her request. Revised pumping schedule, engorgement prevention/treatment and anticipatory guidelines.  OBJECTIVE Infant data: Mother's Current Feeding Choice: Breast Milk  O2 Device: CPAP FiO2 (%): 23 %  Infant feeding assessment No data recorded  Maternal data: G1P0101 Vaginal, Spontaneous Pumping frequency: 8 times/24 hours Pumped volume: 110 mL (110-120 ml) Flange Size: 24 Hands-free pumping top sizes: X-Large Chilton Si)  Pump: Personal, Employee Pump, Hands Free (Spectra S2 (out of pocket) and Medela Freestyle (Wireless) as her employee pump)  ASSESSMENT Infant: Feeding Status: Scheduled 8-11-2-5 Feeding method: Tube/Gavage (Bolus)  Maternal: Milk volume: Normal No S/S of engorgement at this time  INTERVENTIONS/PLAN Interventions: Interventions: Breast feeding basics reviewed; DEBP; Coconut oil; Education Discharge Education: Engorgement and breast care Tools: Pump; Flanges; Coconut oil; Hands-free pumping top Pump Education: Setup, frequency, and cleaning; Milk Storage  Plan: Encouraged pumping every 3 hours, ideally 8 pumping sessions/24 hours She'll switch her pump settings from initiate to maintain mode She'll use ice packs if the pump is not softening her breasts   MGM present. All questions and concerns  answered, family to contact [facility] services PRN.  Consult Status: NICU follow-up NICU Follow-up type: Weekly NICU follow up   Khianna Blazina S Philis Nettle 02/02/2023, 10:59 AM

## 2023-02-06 ENCOUNTER — Ambulatory Visit: Payer: 59

## 2023-02-06 ENCOUNTER — Encounter: Payer: 59 | Admitting: Obstetrics and Gynecology

## 2023-02-07 ENCOUNTER — Ambulatory Visit: Payer: 59

## 2023-02-07 ENCOUNTER — Encounter: Payer: 59 | Admitting: Obstetrics and Gynecology

## 2023-02-07 ENCOUNTER — Encounter: Payer: Self-pay | Admitting: *Deleted

## 2023-02-07 VITALS — BP 133/86 | HR 89

## 2023-02-07 DIAGNOSIS — O1413 Severe pre-eclampsia, third trimester: Secondary | ICD-10-CM

## 2023-02-07 DIAGNOSIS — Z0130 Encounter for examination of blood pressure without abnormal findings: Secondary | ICD-10-CM

## 2023-02-07 NOTE — Progress Notes (Signed)
Subjective:  Brooke Small is a 22 y.o. female here for BP check.   Hypertension ROS: Patient denies any headaches, visual symptoms, RUQ/epigastric pain or other concerning symptoms.  Objective:  LMP 06/15/2022 (Exact Date)   Appearance alert, well appearing, and in no distress. General exam BP noted to be 133/86 P:89 today in office.    Assessment:   Blood Pressure well controlled and stable.   Plan:  Keep PP Appt  Discussed precautions with patient of when to go to MAU. Pt voiced understanding.

## 2023-02-10 ENCOUNTER — Ambulatory Visit (HOSPITAL_COMMUNITY): Payer: Self-pay

## 2023-02-10 NOTE — Lactation Note (Signed)
This note was copied from a baby's chart.  NICU Lactation Consultation Note  Patient Name: Brooke Small [id number] Date: 02/10/2023 Age:20 days  Reason for consult: Weekly NICU follow-up; Primapara; 1st time breastfeeding; NICU baby; Other (Comment); Infant < 6lbs ([facility] employee)  SUBJECTIVE Visited with family of 82 11/32 weeks old AGA NICU female; Brooke Small is a P1 and reports she's pumping consistently and her supply continues to increase, praised her for her efforts. She was told by the milk bank to only leave 2 bottles of breastmilk per day due to storage capacity in the INC. Discussed the difference between overstock and oversupply and let her know that her supply is more on the "upper side". Baby "Brooke Small" asleep, let Brooke Small know that we'll start scoring for feeding readiness next week. Reviewed pumping log, IDF 1/2 and anticipatory guidelines.   OBJECTIVE Infant data: Mother's Current Feeding Choice: Breast Milk  O2 Device: Room Air O2 Flow Rate (L/min): 1 L/min FiO2 (%): 21 %  Infant feeding assessment Scale for Readiness: 3   Maternal data: G1P0101 Vaginal, Spontaneous Pumping frequency: 6-8 times/24 hours Pumped volume: 120 mL (120-180 ml; up to 210-270 ml in the AM) Flange Size: 24 Hands-free pumping top sizes: X-Large Chilton Si)  Pump: Personal, Employee Pump, Hands Free (Spectra S2 (out of pocket) and Medela Freestyle (Wireless) as her employee pump)  ASSESSMENT Infant: Feeding Status: Scheduled 8-11-2-5 Feeding method: Tube/Gavage (Bolus)  Maternal: Milk volume: Normal  INTERVENTIONS/PLAN Interventions: Interventions: Breast feeding basics reviewed; Coconut oil; DEBP; Education; Infant Driven Feeding Algorithm education; Guidelines for Milk Supply and Pumping Schedule Handout Tools: Pump; Flanges; Hands-free pumping top; Coconut oil Pump Education: Setup, frequency, and cleaning; Milk Storage  Plan: Encouraged to continue pumping every 3 hours,  ideally 8 pumping sessions/24 hours STS whenever possible  She'll call for assistance when baby is ready to go to breast   MGM present. All questions and concerns answered, family to contact [facility] services PRN.  Consult Status: NICU follow-up NICU Follow-up type: Weekly NICU follow up   Lajuane Leatham S Philis Nettle 02/10/2023, 11:17 AM

## 2023-02-13 ENCOUNTER — Ambulatory Visit: Payer: 59

## 2023-02-22 ENCOUNTER — Encounter: Payer: 59 | Admitting: Obstetrics and Gynecology

## 2023-02-23 ENCOUNTER — Ambulatory Visit (HOSPITAL_COMMUNITY): Payer: Self-pay

## 2023-02-23 NOTE — Lactation Note (Signed)
This note was copied from a baby's chart.  NICU Lactation Consultation Note  Patient Name: Brooke Small [id number] Date: 02/23/2023 Age:20 wk.o.  Reason for consult: Follow-up assessment; NICU baby; Primapara; 1st time breastfeeding; Late-preterm 34-36.6wks; Infant < 6lbs; Breastfeeding assistance  SUBJECTIVE  SLP requested LC assistance with breastfeeding.  Baby "Brooke Small" has been scoring 2/3's recently.    Mom pre-pumped a full pumping.  Baby placed STS on Mom's chest, no cueing noted.  LC hand expressed drops of breast milk and placed milk onto baby's lips and baby curled into a fetal position.  Mom understands how normal this behavior is and how we don't and can't force baby to PO feed at the breast or bottle.    LC talked about the wonderful impact STS on her chest can be.  Mom aware of lactation support available to her and recommended she have RN call LC when baby is showing active interest at feeding.  OBJECTIVE Infant data: No data recorded O2 Device: Room Air  Infant feeding assessment Scale for Readiness: 2 Scale for Quality: 3   Maternal data: G1P0101 Vaginal, Spontaneous Pumping frequency: 7-8 times per 24 hrs Pumped volume: 120 mL (120-240 ml) Flange Size: 24  Pump: Personal, Employee Pump, Hands Free (Spectra S2 (out of pocket) and Medela Freestyle (Wireless) as her employee pump)  ASSESSMENT Infant: Latch: Too sleepy or reluctant, no latch achieved, no sucking elicited. Audible Swallowing: None Type of Nipple: Everted at rest and after stimulation (short nipple shafts, semi-flat, compressible areola) Comfort (Breast/Nipple): Soft / non-tender Hold (Positioning): Full assist, staff holds infant at breast LATCH Score: 4  Feeding Status: Scheduled 8-11-2-5 Feeding method: Breast  Maternal: Milk volume: Normal  INTERVENTIONS/PLAN Interventions: Interventions: Skin to skin; Breast massage; Hand express; DEBP Tools: Pump; Flanges Pump Education:  Setup, frequency, and cleaning; Milk Storage  Plan: Consult Status: NICU follow-up NICU Follow-up type: Assist with IDF-1 (Mother to pre-pump before breastfeeding)   Judee Clara 02/23/2023, 2:40 PM

## 2023-02-28 ENCOUNTER — Other Ambulatory Visit (HOSPITAL_COMMUNITY): Payer: Self-pay

## 2023-03-12 ENCOUNTER — Encounter: Payer: Self-pay | Admitting: Obstetrics & Gynecology

## 2023-03-12 ENCOUNTER — Ambulatory Visit: Payer: 59 | Admitting: Obstetrics & Gynecology

## 2023-03-12 DIAGNOSIS — Z30017 Encounter for initial prescription of implantable subdermal contraceptive: Secondary | ICD-10-CM

## 2023-03-12 DIAGNOSIS — Z3202 Encounter for pregnancy test, result negative: Secondary | ICD-10-CM

## 2023-03-12 LAB — POCT URINE PREGNANCY: Preg Test, Ur: NEGATIVE

## 2023-03-12 MED ORDER — ETONOGESTREL 68 MG ~~LOC~~ IMPL
68.0000 mg | DRUG_IMPLANT | Freq: Once | SUBCUTANEOUS | Status: AC
Start: 2023-03-12 — End: 2023-03-12
  Administered 2023-03-12: 68 mg via SUBCUTANEOUS

## 2023-03-12 NOTE — Patient Instructions (Signed)
You need a pap after you turn 23  Nexplanon Instructions After Insertion  Keep bandage clean and dry for 24 hours  May use ice/Tylenol/Ibuprofen for soreness or pain  If you develop fever, drainage or increased warmth from incision site-contact office immediately

## 2023-03-12 NOTE — Progress Notes (Signed)
Post Partum Visit Note  Brooke Small is a 22 y.o. G52P0101 female who presents for a postpartum visit. She is 6 weeks postpartum following a induced vaginal delivery at 32 weeks due to uncontrolled severe preeclampsia.  I have fully reviewed the prenatal and intrapartum course.   Anesthesia: epidural. Postpartum course has been uncomplicated. She stopped taking BP medication because of normal BPs at home. Baby is doing well, home now after prolonged NICU stay. Baby is feeding by  pumping . Bleeding  little bit of bleeding yesterday none today . Bowel function is normal. Bladder function is normal. Patient is not sexually active. Desired contraception method is  Nexplanon . Postpartum depression screening: negative.  The pregnancy intention screening data noted above was reviewed. Potential methods of contraception were discussed. The patient elected to proceed with Nexplanon.   Edinburgh Postnatal Depression Scale - 03/12/23 1351       Edinburgh Postnatal Depression Scale:  In the Past 7 Days   I have been able to laugh and see the funny side of things. 0    I have looked forward with enjoyment to things. 0    I have blamed myself unnecessarily when things went wrong. 1    I have been anxious or worried for no good reason. 1    I have felt scared or panicky for no good reason. 2    Things have been getting on top of me. 1    I have been so unhappy that I have had difficulty sleeping. 0    I have felt sad or miserable. 1    I have been so unhappy that I have been crying. 1    The thought of harming myself has occurred to me. 0    Edinburgh Postnatal Depression Scale Total 7             Health Maintenance Due  Topic Date Due   HPV VACCINES (1 - 3-dose series) Never done   COVID-19 Vaccine (2 - 2023-24 season) 12/24/2022    The following portions of the patient's history were reviewed and updated as appropriate: allergies, current medications, past family history, past medical  history, past social history, past surgical history, and problem list.  Review of Systems Pertinent items noted in HPI and remainder of comprehensive ROS otherwise negative.  Objective:  BP (!) 132/91   Pulse 81   Wt 261 lb (118.4 kg)   LMP 06/15/2022 (Exact Date)   Breastfeeding Yes Comment: Pumping  BMI 43.43 kg/m    General:  alert and no distress   Breasts:  normal, done with chaperone present  Lungs: clear to auscultation bilaterally  Heart:  regular rate and rhythm  Abdomen: soft, non-tender; bowel sounds normal; no masses,  no organomegaly   GU exam:  not indicated       Nexplanon Insertion Procedure Patient identified, informed consent performed, consent signed.   Patient does understand that irregular bleeding is a very common side effect of this medication.  Discussed other risks and benefits of this contraception modality. She was advised to have backup contraception for one week after placement. Pregnancy test in clinic today was negative.  Appropriate time out taken.  Patient's left arm was prepped and draped in the usual sterile fashion. The ruler used to measure and mark insertion area.  Patient was prepped with alcohol swab and then injected with 3 ml of 1% lidocaine.  She was prepped with betadine, Nexplanon removed from packaging,  Device  confirmed in needle, then inserted full length of needle and withdrawn per handbook instructions. Nexplanon was able to palpated in the patient's arm; patient palpated the insert herself. There was minimal blood loss.  Patient insertion site covered with guaze and a pressure bandage to reduce any bruising.  The patient tolerated the procedure well and was given post procedure instructions.   Assessment:   1. Nexplanon insertion 2. Postpartum care following vaginal delivery  Plan:   Essential components of care per ACOG recommendations:  1.  Mood and well being: Patient with negative depression screening today. Reviewed local  resources for support.  - Patient tobacco use? No.   - hx of drug use? No.    2. Infant care and feeding:  -Patient currently breastmilk feeding? Yes. Reviewed importance of draining breast regularly to support lactation.  -Social determinants of health (SDOH) reviewed in EPIC. No concerns.  3. Sexuality, contraception and birth spacing - Patient does not want a pregnancy in the next year.  Desired family size is unknown. - Reviewed reproductive life planning. Reviewed contraceptive methods based on pt preferences and effectiveness.  Patient desired Hormonal Implant,  Nexplanon placed today and can be in place for up to four years.   - Discussed birth spacing of 18 months  4. Sleep and fatigue -Encouraged family/partner/community support of 4 hrs of uninterrupted sleep to help with mood and fatigue  5. Physical Recovery  - Discussed patients delivery and complications. She describes her labor as good. - Patient had a Vaginal, no problems at delivery. Patient had a 1st degree laceration. Perineal healing reviewed. Patient expressed understanding - Patient has urinary incontinence? No. - Patient is safe to resume physical and sexual activity  6.  Health Maintenance - HM due items addressed Yes - Too young for pap smear, due next year, patient informed  7. Chronic Disease/Pregnancy Condition follow up:  Severe preeclampsia - Mildly elevated BP here today, patient says they are normal at home. - Advised to continue BP checks at home, if elevated, will need to restart medication. - PCP follow up recommended.    Jaynie Collins, MD [facility] [employer], [facility]
